# Patient Record
Sex: Male | Born: 1960 | Race: White | Hispanic: No | Marital: Married | Smoking: Never smoker
Health system: Southern US, Community
[De-identification: ages and names within clinical notes are randomized; demographics above are authoritative.]

---

## 2007-03-06 ENCOUNTER — Ambulatory Visit: Payer: Self-pay | Admitting: Emergency Medicine

## 2008-05-29 ENCOUNTER — Ambulatory Visit: Payer: Self-pay | Admitting: Family Medicine

## 2009-07-18 DIAGNOSIS — D239 Other benign neoplasm of skin, unspecified: Secondary | ICD-10-CM

## 2009-07-18 HISTORY — DX: Other benign neoplasm of skin, unspecified: D23.9

## 2010-07-16 ENCOUNTER — Ambulatory Visit: Payer: Self-pay | Admitting: Urology

## 2010-10-13 ENCOUNTER — Ambulatory Visit: Payer: Self-pay | Admitting: General Practice

## 2010-10-27 ENCOUNTER — Inpatient Hospital Stay: Payer: Self-pay | Admitting: General Practice

## 2011-05-20 ENCOUNTER — Ambulatory Visit: Payer: Self-pay | Admitting: Family Medicine

## 2011-12-30 IMAGING — US US RENAL KIDNEY
1 series · 17 of 25 positions shown · non-contrast
Comparison: none

REASON FOR EXAM: renal colic  mild chronic renal insufficiency hx of
nephrolithaisis
COMMENTS:

PROCEDURE:     [facility]  - July 16, 2010  [DATE]
RESULT:     The right kidney measures 12.2 cm and the left is 12 cm. There
is a 2.4 cm, simple cyst in the left kidney. Bilateral renal flow is
present. Urine is noted in the bladder.

[Series 1: us renal kidney · 17 of 27 slices shown]
[im 1/27]
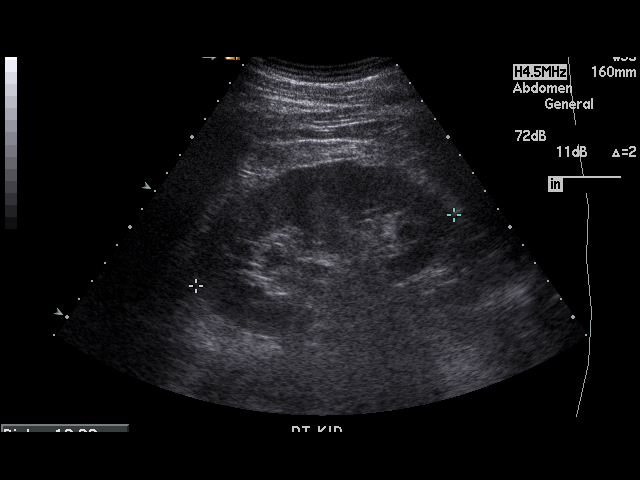
[im 3/27]
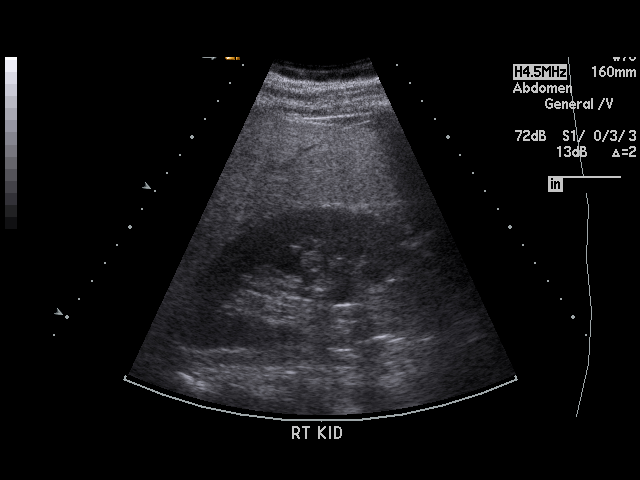
[im 4/27]
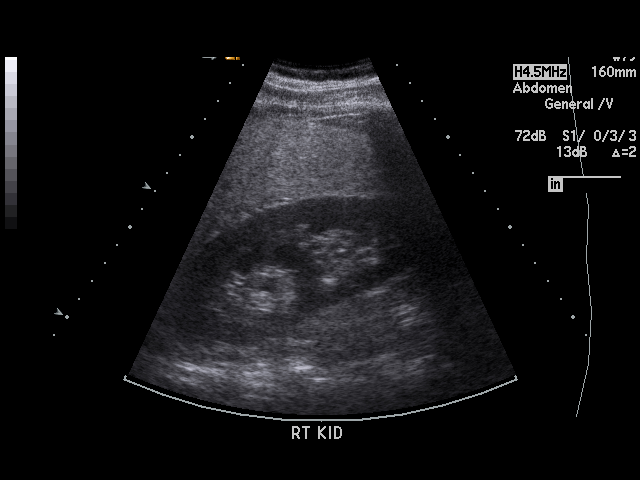
[im 6/27]
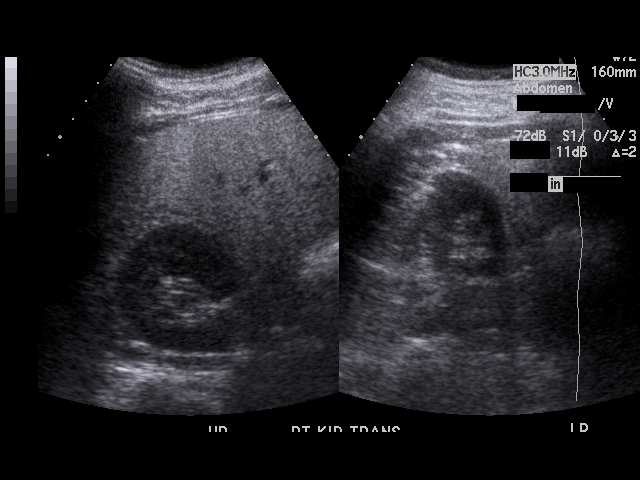
[im 7/27]
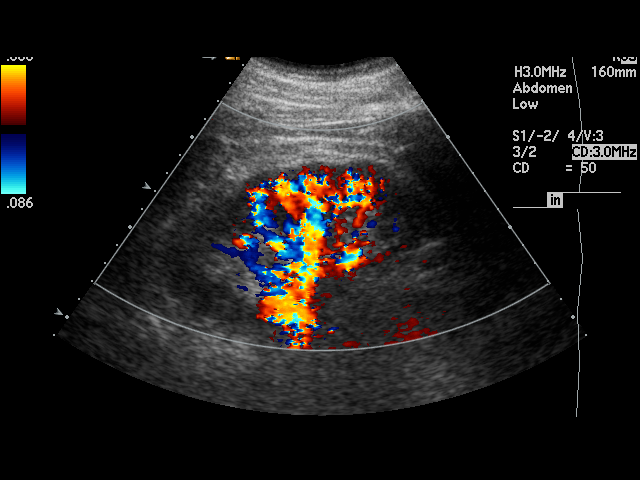
[im 9/27]
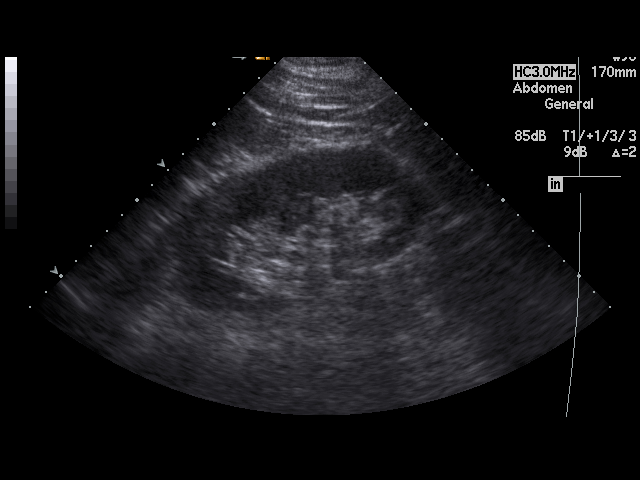
[im 10/27]
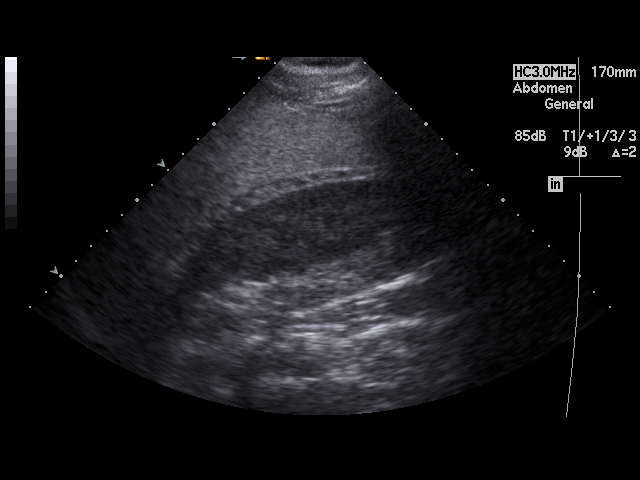
[im 12/27]
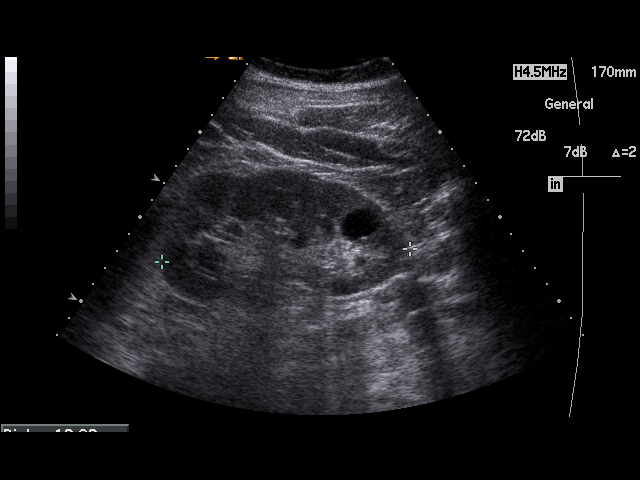
[im 14/27]
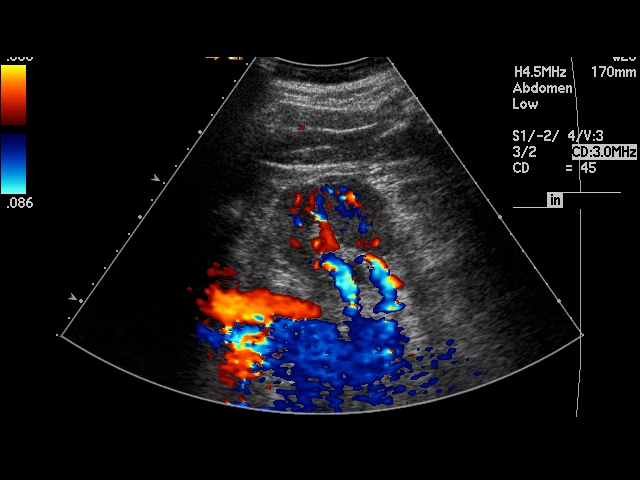
[im 15/27]
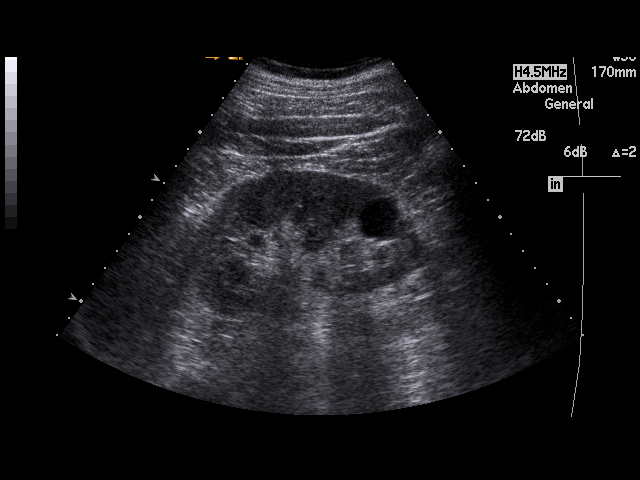
[im 17/27]
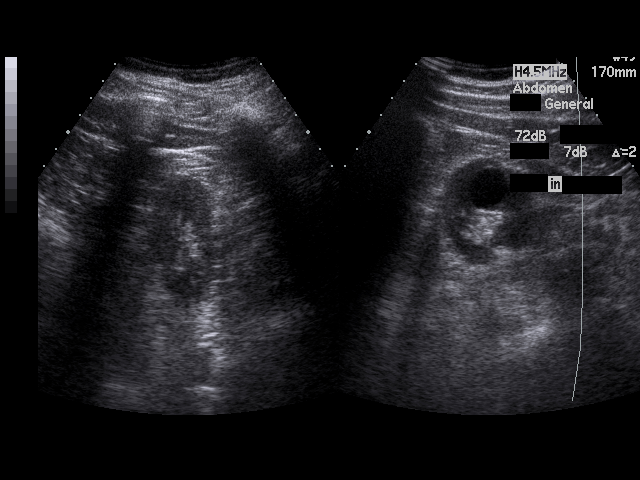
[im 18/27]
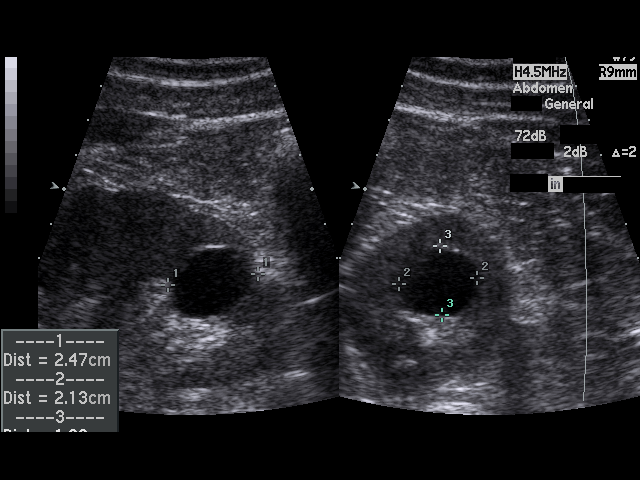
[im 20/27]
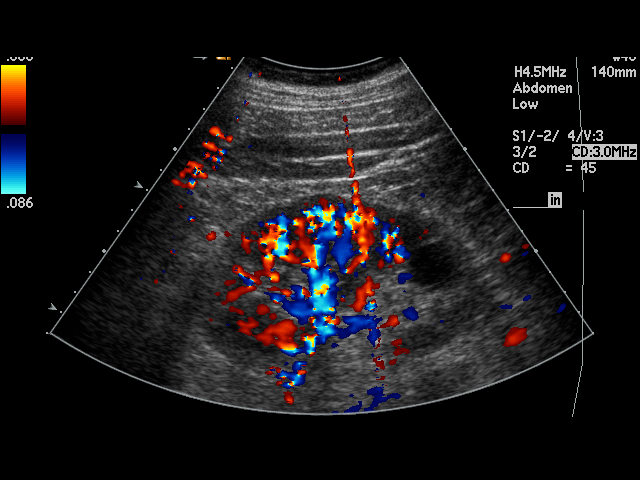
[im 21/27]
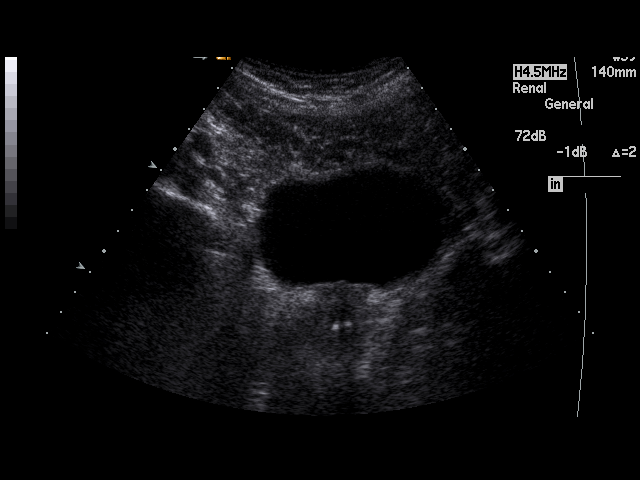
[im 23/27]
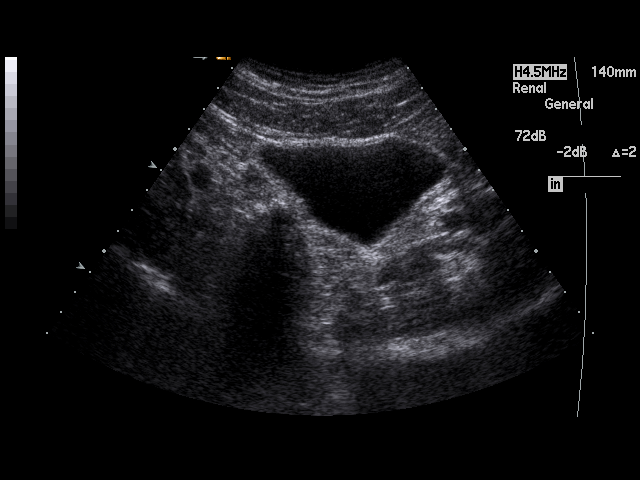
[im 24/27]
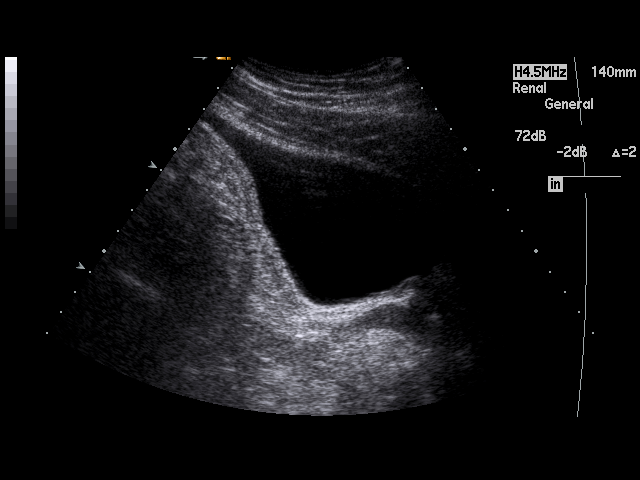
[im 27/27]
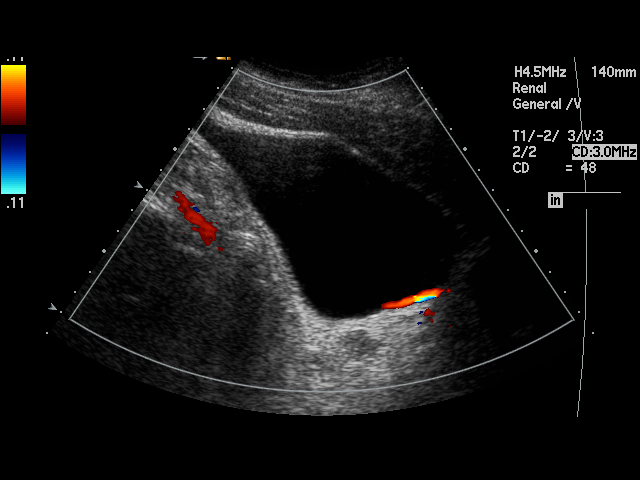

[17 of 25 positions shown; findings below may reference images not displayed]

IMPRESSION: Simple cyst of the left kidney. Otherwise, unremarkable
exam. Renal echotexture, size and shape is normal. No definite stones are
noted.

## 2018-02-04 ENCOUNTER — Telehealth: Payer: Self-pay | Admitting: Gastroenterology

## 2018-02-04 NOTE — Telephone Encounter (Signed)
Pathology from previous colonoscopy has been faxed.

## 2018-02-04 NOTE — Telephone Encounter (Signed)
[facility] left vm to [facility] Pathology report from referral from 2015 please fax to [phone]  Call back # [phone] ext [phone]

## 2019-02-10 ENCOUNTER — Other Ambulatory Visit: Payer: Self-pay | Admitting: Nephrology

## 2019-02-10 DIAGNOSIS — N183 Chronic kidney disease, stage 3 unspecified: Secondary | ICD-10-CM

## 2019-02-14 ENCOUNTER — Encounter (INDEPENDENT_AMBULATORY_CARE_PROVIDER_SITE_OTHER): Payer: Self-pay

## 2019-02-14 ENCOUNTER — Ambulatory Visit
Admission: RE | Admit: 2019-02-14 | Discharge: 2019-02-14 | Disposition: A | Discharge status: Home / Self Care | Payer: BLUE CROSS/BLUE SHIELD | Source: Ambulatory Visit | Attending: Nephrology | Admitting: Nephrology

## 2019-02-14 ENCOUNTER — Other Ambulatory Visit: Payer: Self-pay

## 2019-02-14 DIAGNOSIS — N183 Chronic kidney disease, stage 3 unspecified: Secondary | ICD-10-CM

## 2019-04-20 ENCOUNTER — Ambulatory Visit: Payer: Self-pay | Admitting: Urology

## 2020-12-04 ENCOUNTER — Encounter: Payer: Self-pay | Admitting: Dermatology

## 2020-12-04 ENCOUNTER — Telehealth: Payer: Self-pay

## 2020-12-04 ENCOUNTER — Other Ambulatory Visit: Payer: Self-pay

## 2020-12-04 ENCOUNTER — Ambulatory Visit (INDEPENDENT_AMBULATORY_CARE_PROVIDER_SITE_OTHER): Payer: BC Managed Care – PPO | Admitting: Dermatology

## 2020-12-04 DIAGNOSIS — Q825 Congenital non-neoplastic nevus: Secondary | ICD-10-CM

## 2020-12-04 DIAGNOSIS — D1800 Hemangioma unspecified site: Secondary | ICD-10-CM

## 2020-12-04 DIAGNOSIS — Z1283 Encounter for screening for malignant neoplasm of skin: Secondary | ICD-10-CM

## 2020-12-04 DIAGNOSIS — B351 Tinea unguium: Secondary | ICD-10-CM | POA: Diagnosis not present

## 2020-12-04 DIAGNOSIS — L918 Other hypertrophic disorders of the skin: Secondary | ICD-10-CM

## 2020-12-04 DIAGNOSIS — B353 Tinea pedis: Secondary | ICD-10-CM

## 2020-12-04 DIAGNOSIS — L814 Other melanin hyperpigmentation: Secondary | ICD-10-CM

## 2020-12-04 DIAGNOSIS — L719 Rosacea, unspecified: Secondary | ICD-10-CM

## 2020-12-04 DIAGNOSIS — D492 Neoplasm of unspecified behavior of bone, soft tissue, and skin: Secondary | ICD-10-CM

## 2020-12-04 DIAGNOSIS — D225 Melanocytic nevi of trunk: Secondary | ICD-10-CM

## 2020-12-04 DIAGNOSIS — L578 Other skin changes due to chronic exposure to nonionizing radiation: Secondary | ICD-10-CM

## 2020-12-04 DIAGNOSIS — D229 Melanocytic nevi, unspecified: Secondary | ICD-10-CM

## 2020-12-04 DIAGNOSIS — L821 Other seborrheic keratosis: Secondary | ICD-10-CM

## 2020-12-04 NOTE — Telephone Encounter (Signed)
Called pt discussed we need to know the name of his PCP he will be seeing this month to discuss his liver disease hx, pt do not have the name with him he will call me back with the doctors name

## 2020-12-04 NOTE — Progress Notes (Signed)
New Patient Visit  Subjective  Roy Austin is a 60 y.o. male who presents for the following: Annual Exam. Hx of Dysplastic nevus. He also has history of toenail dystrophy. It has been present since he was in the [employer]. The patient presents for Total-Body Skin Exam (TBSE) for skin cancer screening and mole check.  The following portions of the chart were reviewed this encounter and updated as appropriate:   Allergies  Meds  Problems  Med Hx  Surg Hx  Fam Hx     Review of Systems:  No other skin or systemic complaints except as noted in HPI or Assessment and Plan.  Objective  Well appearing patient in no apparent distress; mood and affect are within normal limits.  A full examination was performed including scalp, head, eyes, ears, nose, lips, neck, chest, axillae, abdomen, back, buttocks, bilateral upper extremities, bilateral lower extremities, hands, feet, fingers, toes, fingernails, and toenails. All findings within normal limits unless otherwise noted below.  Objective  Left lower quad abdomen: 0.6 cm irregular brown macule   Objective  toenails: Scaling and maceration web spaces and over distal and lateral soles.   Objective  Left post shoulder: Light brown flat macule    Assessment & Plan  Neoplasm of skin Left lower quad abdomen  Epidermal / dermal shaving  Lesion diameter (cm):  0.6 Informed consent: discussed and consent obtained   Timeout: patient name, date of birth, surgical site, and procedure verified   Procedure prep:  Patient was prepped and draped in usual sterile fashion Prep type:  Isopropyl alcohol Anesthesia: the lesion was anesthetized in a standard fashion   Anesthetic:  1% lidocaine w/ epinephrine 1-100,000 buffered w/ 8.4% NaHCO3 Hemostasis achieved with: pressure, aluminum chloride and electrodesiccation   Outcome: patient tolerated procedure well   Post-procedure details: sterile dressing applied and wound care instructions  given   Dressing type: bandage and petrolatum    Specimen 1 - Surgical pathology Differential Diagnosis: R/O Dysplastic nevus vs other   Check Margins: No 0.6 cm irregular brown macule  Rosacea Head - Anterior (Face) Discussed BBL laser and topical treatment. Patient declines at this time. Rosacea Erythematotelangiectatic  Rosacea is a chronic progressive skin condition usually affecting the face of adults, causing redness and/or acne bumps. It is treatable but not curable. It sometimes affects the eyes (ocular rosacea) as well. It may respond to topical and/or systemic medication and can flare with stress, sun exposure, alcohol, exercise and some foods.  Daily application of broad spectrum spf 30+ sunscreen to face is recommended to reduce flares.   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  Severe toenail dystrophy   Discussed topical versus oral treatment. Will start Lamisil 250 mg 1 a day pending okay by his primary care physician. He has history of "liver problems" (hereditary cirrhosis?).  If his PCP gives an okay for oral Lamisil, will follow-up in about 5 to 6 weeks.  Congenital nevus Left post shoulder  Benign-appearing.  Observation.  Call clinic for new or changing lesions.  Recommend daily use of broad spectrum spf 30+ sunscreen to sun-exposed areas.   Lentigines - Scattered tan macules - Due to sun exposure - Benign-appering, observe - Recommend daily broad spectrum sunscreen SPF 30+ to sun-exposed areas, reapply every 2 hours as needed. - Call for any changes  Seborrheic Keratoses - Stuck-on, waxy, tan-brown papules and plaques  - Discussed benign etiology and prognosis. - Observe - Call for any changes  Melanocytic Nevi - Tan-brown and/or pink-flesh-colored symmetric macules and papules - Benign appearing on exam today - Observation - Call clinic for new or changing moles - Recommend daily use of broad spectrum spf 30+ sunscreen to  sun-exposed areas.   Hemangiomas - Red papules - Discussed benign nature - Observe - Call for any changes  Actinic Damage - Chronic, secondary to cumulative UV/sun exposure - diffuse scaly erythematous macules with underlying dyspigmentation - Recommend daily broad spectrum sunscreen SPF 30+ to sun-exposed areas, reapply every 2 hours as needed.  - Call for new or changing lesions.  Acrochordons (Skin Tags) -discussed removal may consider on follow-up. - Fleshy, skin-colored pedunculated papules - Benign appearing.  - Observe. - If desired, they can be removed with an in office procedure that is not covered by insurance. - Please call the clinic if you notice any new or changing lesions.  Skin cancer screening performed today.  Follow-up in 5 to 6 weeks if we start oral Lamisil for his tinea pedis ending approval by his PCP. If not he may return for removal of skin tags whenever he is ready. Otherwise follow-up no later than 1 year.  IMarye Round, CMA, am acting as scribe for Sarina Ser, MD .  Documentation: I have reviewed the above documentation for accuracy and completeness, and I agree with the above.  Sarina Ser, MD

## 2020-12-04 NOTE — Patient Instructions (Signed)

## 2020-12-06 ENCOUNTER — Telehealth: Payer: Self-pay

## 2020-12-06 NOTE — Telephone Encounter (Signed)
-----   Message from Ralene Bathe, MD sent at 12/05/2020  7:12 PM EST ----- Diagnosis Skin , left lower quad abdomen DYSPLASTIC COMPOUND NEVUS WITH MODERATE ATYPIA, CLOSE TO MARGIN  Dysplastic Moderate Recheck next visit If no appt, make an appt for 6 months

## 2020-12-06 NOTE — Telephone Encounter (Signed)
Advised patient of results and scheduled 6 month follow up/hd 

## 2020-12-11 ENCOUNTER — Telehealth: Payer: Self-pay

## 2020-12-11 NOTE — Telephone Encounter (Signed)
-----   Message from Ralene Bathe, MD sent at 12/10/2020  4:04 PM EST ----- Did you communicate with this pt's PCP?  Need to know whether we can prescribe Lamisil. ----- Message ----- From: Arvin Collard, CMA Sent: 12/06/2020  10:43 AM EST To: Ralene Bathe, MD  I spoke with him today regarding his pathology and he said you wanted his PCP info. Dr Tonita Cong in [place] [phone]

## 2020-12-11 NOTE — Telephone Encounter (Signed)
Called  patients PCP office in [facility] Dr Karmen Stabs spoke with with Danielle Rankin, discussed with her we need to know whether we can prescribe Lamisil for this pt, she will relay this message to nurse and she will call me back

## 2020-12-12 NOTE — Telephone Encounter (Signed)
The MD within patient's PCP called back and left message on our nurse line. Physician was talking so fast I barley understood his name or call back number.

## 2020-12-16 ENCOUNTER — Telehealth: Payer: Self-pay

## 2020-12-16 NOTE — Telephone Encounter (Signed)
Called pt discussed we will need to order labs hepatic panel to recheck LFTs , he can come by the office to pick up this lab order, pt report he will be going to see a PCP in the next few weeks and he will have all his labs drawn at that time

## 2020-12-27 IMAGING — US US RENAL
1 series · 14 of 25 positions shown · non-contrast
Comparison: None available.

CLINICAL DATA: Initial evaluation for chronic kidney disease, stage
III.

EXAM:
RENAL / URINARY TRACT ULTRASOUND COMPLETE

[Series 1: us renal · 0.28mm/px · 14 of 48 slices shown]
[im 1/48]
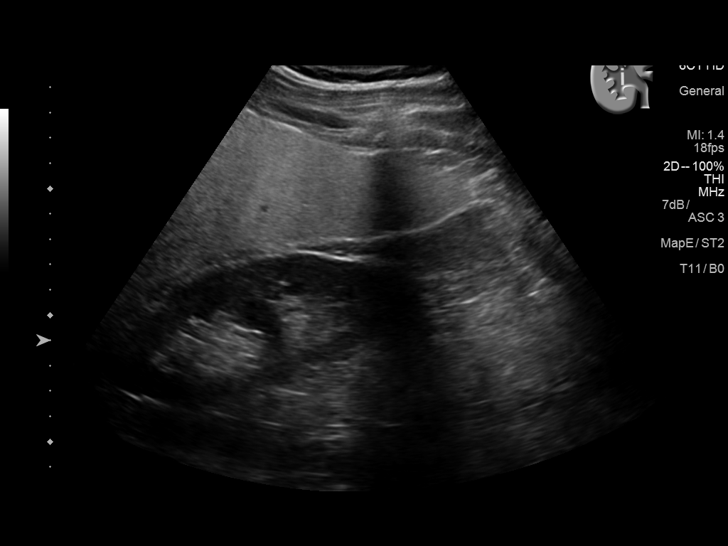
[im 4/48]
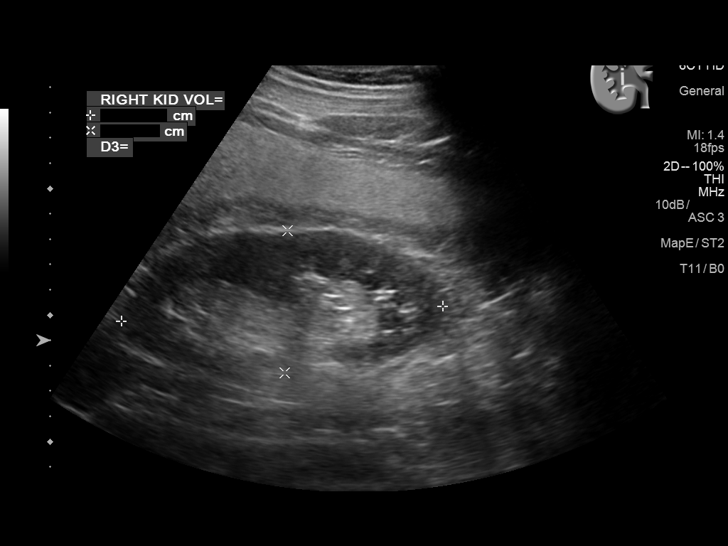
[im 8/48]
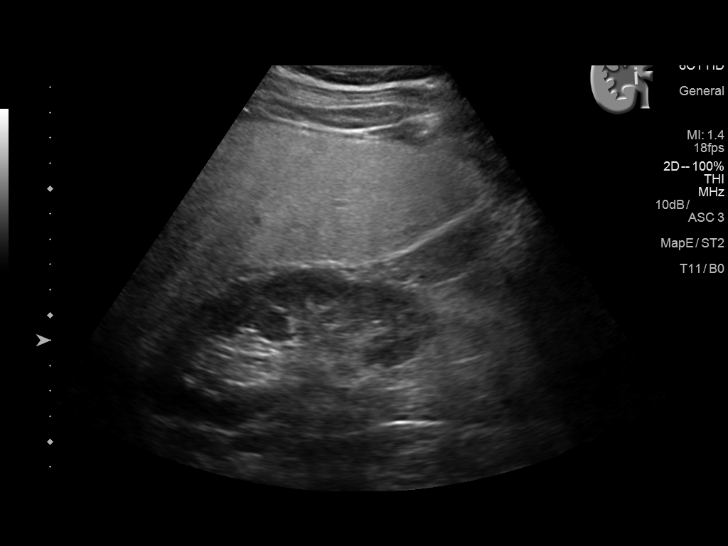
[im 12/48]
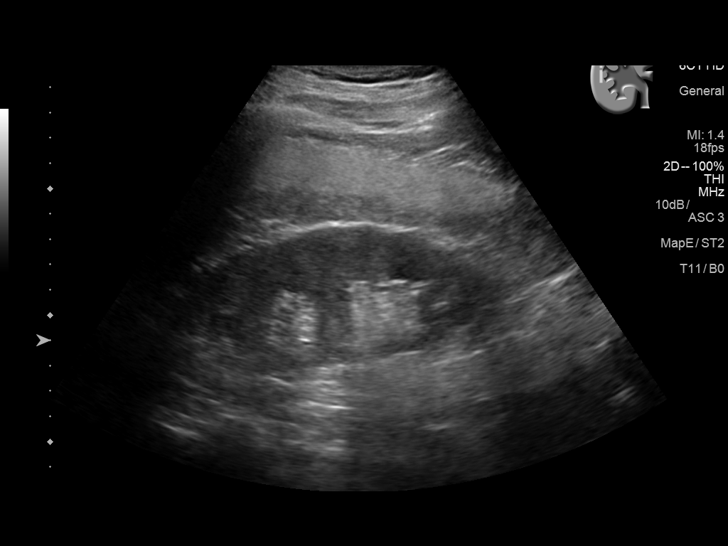
[im 16/48]
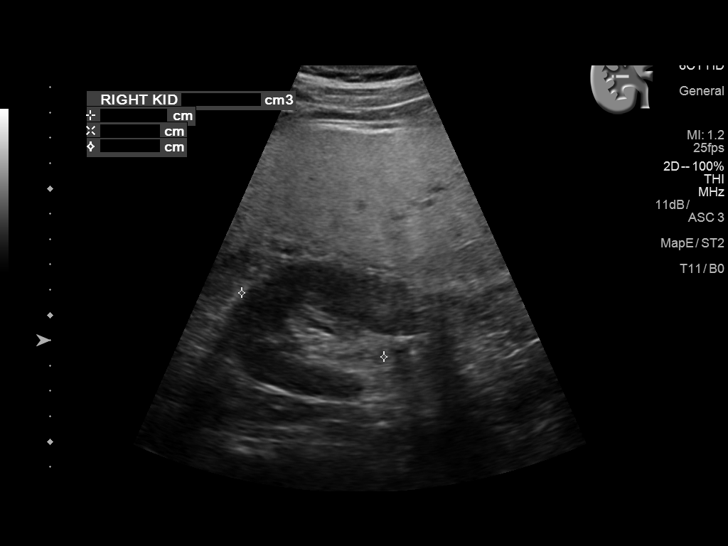
[im 18/48]
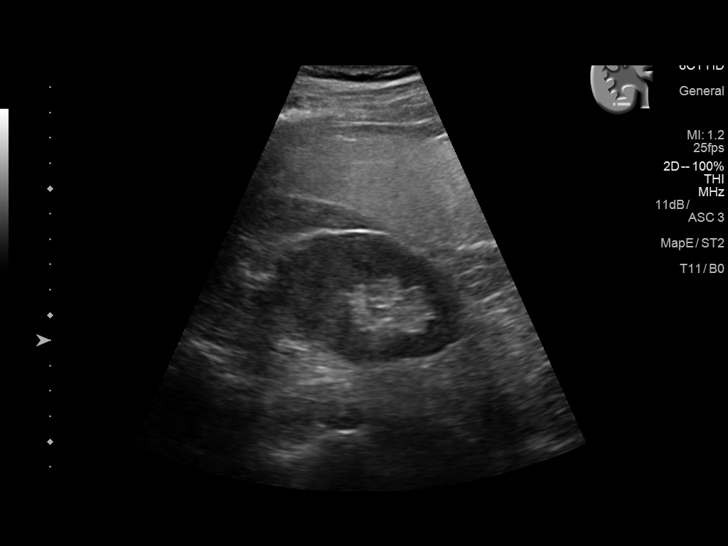
[im 22/48]
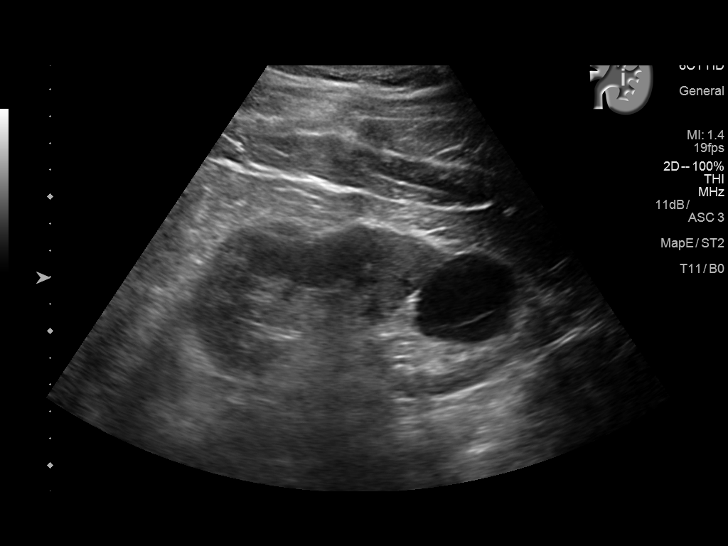
[im 26/48]
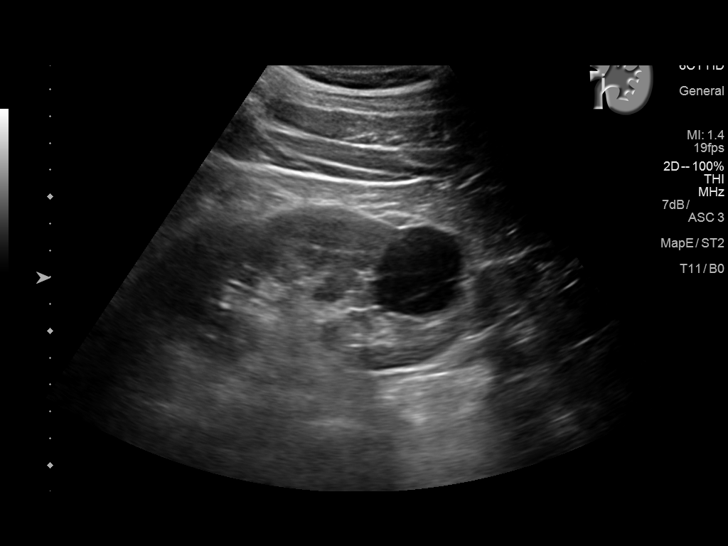
[im 30/48]
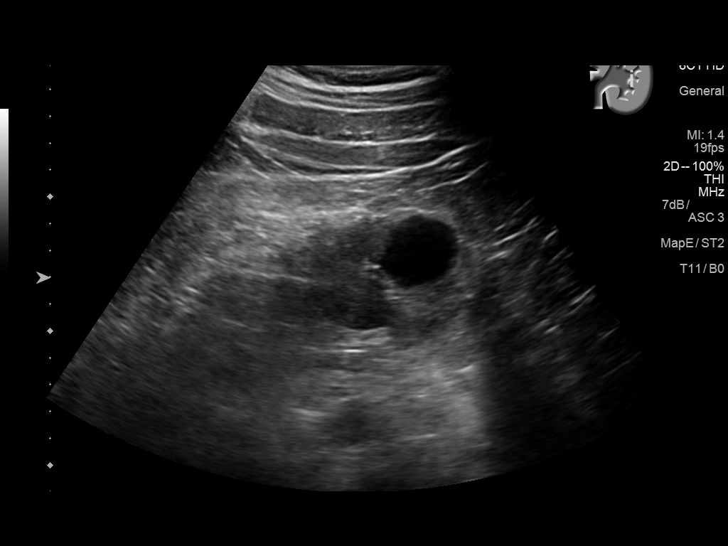
[im 32/48]
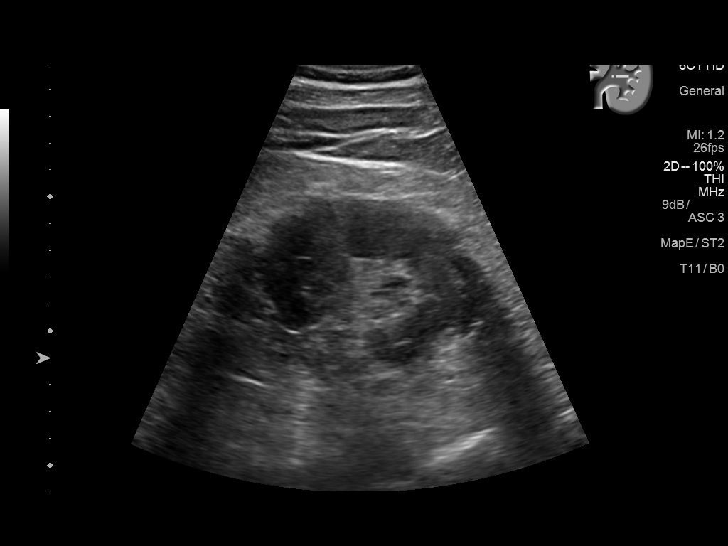
[im 36/48]
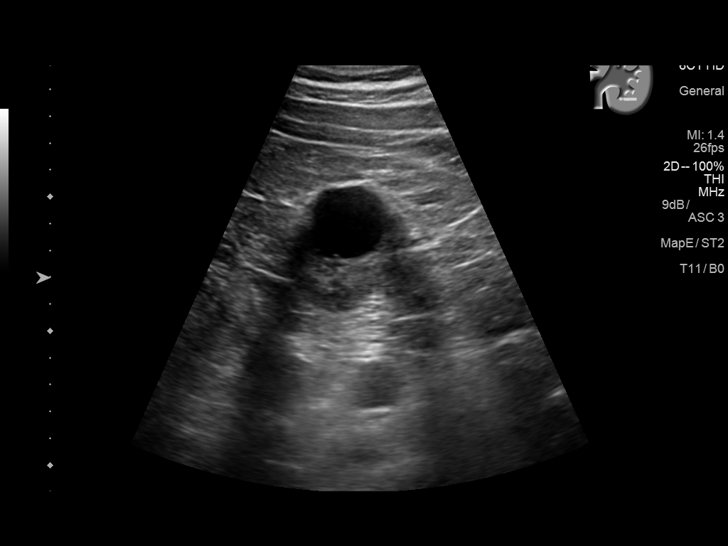
[im 40/48]
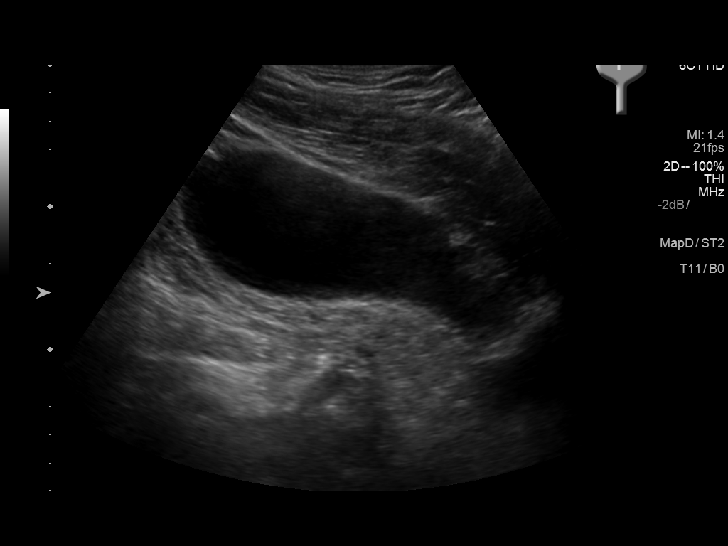
[im 44/48]
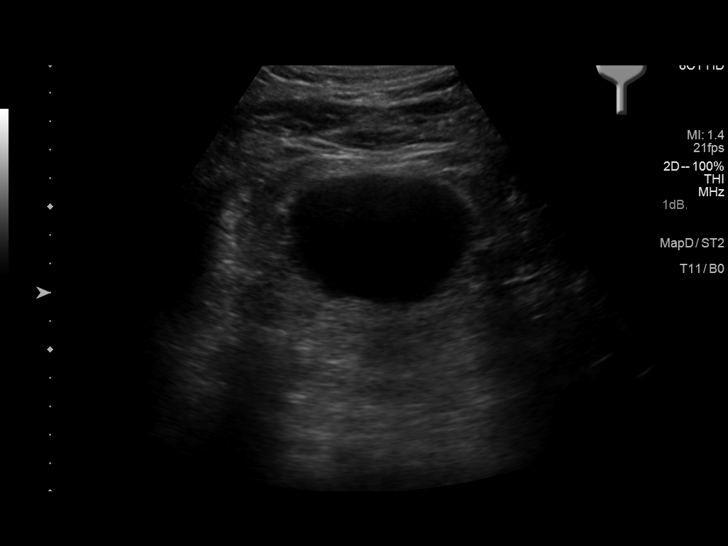
[im 48/48]
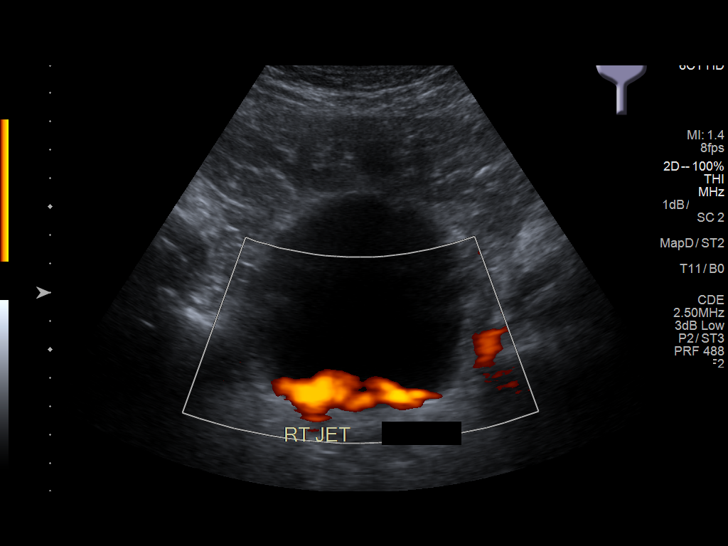

[14 of 25 positions shown; findings below may reference images not displayed]

FINDINGS: Right Kidney:

Renal measurements: 12.7 x 5.6 x 6.2 cm = volume: 230.4 mL .
Echogenicity within normal limits. No mass or hydronephrosis
visualized.

Left Kidney:

Renal measurements: 12.4 x 6.0 x 5.7 cm = volume: 221.5 mL.
Echogenicity within normal limits. No hydronephrosis. 3.4 x 3.3 x
3.6 cm cyst present at the lower pole the left kidney. A single thin
internal septation. No associated vascularity.

Bladder:

Appears normal for degree of bladder distention.
IMPRESSION: 1. 3.6 cm septated cyst at the lower pole of the left kidney.
2. Otherwise unremarkable renal ultrasound.  No hydronephrosis.

## 2021-01-08 NOTE — Telephone Encounter (Signed)
I spoke to this patient on March 14, per his PCP in [place] he do not recommend starting Lamisil without a repeat of LFT's due to abnormal labs drawn in 2021, discussed with pt he could come by to get a lab script for LFT;s per Dr Nicole Kindred, pt declined, pt reported he will wait until he get his new PCP established here in [place] then he will get a repeat lab order

## 2021-01-08 NOTE — Telephone Encounter (Signed)
OK, we will wait until he gets his new PCP, then if the PCP does not order LFTs, we will do it when he is ready or at his next visit.

## 2021-01-08 NOTE — Telephone Encounter (Signed)
Please just try to contact the doctor again asking original question(s) -- see chart note.

## 2021-06-18 ENCOUNTER — Ambulatory Visit: Payer: BC Managed Care – PPO | Admitting: Dermatology

## 2022-05-14 ENCOUNTER — Encounter: Payer: BC Managed Care – PPO | Admitting: Dermatology

## 2022-05-25 ENCOUNTER — Encounter: Payer: BC Managed Care – PPO | Admitting: Dermatology

## 2022-06-04 ENCOUNTER — Ambulatory Visit: Payer: BC Managed Care – PPO | Admitting: Dermatology

## 2022-06-04 DIAGNOSIS — L578 Other skin changes due to chronic exposure to nonionizing radiation: Secondary | ICD-10-CM

## 2022-06-04 DIAGNOSIS — L918 Other hypertrophic disorders of the skin: Secondary | ICD-10-CM

## 2022-06-04 DIAGNOSIS — Z1283 Encounter for screening for malignant neoplasm of skin: Secondary | ICD-10-CM

## 2022-06-04 DIAGNOSIS — D229 Melanocytic nevi, unspecified: Secondary | ICD-10-CM

## 2022-06-04 DIAGNOSIS — L814 Other melanin hyperpigmentation: Secondary | ICD-10-CM

## 2022-06-04 DIAGNOSIS — B351 Tinea unguium: Secondary | ICD-10-CM | POA: Diagnosis not present

## 2022-06-04 DIAGNOSIS — D1800 Hemangioma unspecified site: Secondary | ICD-10-CM

## 2022-06-04 DIAGNOSIS — Z86018 Personal history of other benign neoplasm: Secondary | ICD-10-CM | POA: Diagnosis not present

## 2022-06-04 DIAGNOSIS — L821 Other seborrheic keratosis: Secondary | ICD-10-CM

## 2022-06-04 DIAGNOSIS — B353 Tinea pedis: Secondary | ICD-10-CM | POA: Diagnosis not present

## 2022-06-04 MED ORDER — JUBLIA 10 % EX SOLN: 1.0000 "application " | Freq: Every day | CUTANEOUS | 11 refills | Status: DC

## 2022-06-04 MED ORDER — KETOCONAZOLE 2 % EX CREA: 1.0000 | TOPICAL_CREAM | Freq: Every day | CUTANEOUS | 11 refills | Status: DC

## 2022-06-04 NOTE — Progress Notes (Signed)
   Follow-Up Visit   Subjective  Roy Austin is a 61 y.o. male who presents for the following: Annual Exam (History of dysplastic nevi - The patient presents for Total-Body Skin Exam (TBSE) for skin cancer screening and mole check.  The patient has spots, moles and lesions to be evaluated, some may be new or changing and the patient has concerns that these could be cancer./).  The following portions of the chart were reviewed this encounter and updated as appropriate:   Allergies  Meds  Problems  Med Hx  Surg Hx  Fam Hx     Review of Systems:  No other skin or systemic complaints except as noted in HPI or Assessment and Plan.  Objective  Well appearing patient in no apparent distress; mood and affect are within normal limits.  A full examination was performed including scalp, head, eyes, ears, nose, lips, neck, chest, axillae, abdomen, back, buttocks, bilateral upper extremities, bilateral lower extremities, hands, feet, fingers, toes, fingernails, and toenails. All findings within normal limits unless otherwise noted below.  Left LQA Well healed biopsy site   Assessment & Plan   Acrochordons (Skin Tags) - Fleshy, skin-colored pedunculated papules - Benign appearing.  - Observe. - If desired, they can be removed with an in office procedure that is not covered by insurance. - Please call the clinic if you notice any new or changing lesions.  Lentigines - Scattered tan macules - Due to sun exposure - Benign-appearing, observe - Recommend daily broad spectrum sunscreen SPF 30+ to sun-exposed areas, reapply every 2 hours as needed. - Call for any changes  Seborrheic Keratoses - Stuck-on, waxy, tan-brown papules and/or plaques  - Benign-appearing - Discussed benign etiology and prognosis. - Observe - Call for any changes  Melanocytic Nevi - Tan-brown and/or pink-flesh-colored symmetric macules and papules - Benign appearing on exam today - Observation - Call  clinic for new or changing moles - Recommend daily use of broad spectrum spf 30+ sunscreen to sun-exposed areas.   Hemangiomas - Red papules - Discussed benign nature - Observe - Call for any changes  Actinic Damage - Chronic condition, secondary to cumulative UV/sun exposure - diffuse scaly erythematous macules with underlying dyspigmentation - Recommend daily broad spectrum sunscreen SPF 30+ to sun-exposed areas, reapply every 2 hours as needed.  - Staying in the shade or wearing long sleeves, sun glasses (UVA+UVB protection) and wide brim hats (4-inch brim around the entire circumference of the hat) are also recommended for sun protection.  - Call for new or changing lesions.  Skin cancer screening performed today.  History of dysplastic nevus Left LQA Clear. Observe for recurrence. Call clinic for new or changing lesions.  Recommend regular skin exams, daily broad-spectrum spf 30+ sunscreen use, and photoprotection.     Tinea pedis and Unguium of both feet Bilateral feet Chronic and persistent condition with duration or expected duration over one year. Condition is symptomatic / bothersome to patient. Not to goal. Start: Ketoconazole 2% cream qhs to feet,  Jublia solution qhs to nails  Efinaconazole (JUBLIA) 10 % SOLN - Bilateral feet Apply 1 application  topically at bedtime. ketoconazole (NIZORAL) 2 % cream - Bilateral feet Apply 1 Application topically at bedtime.  Return in about 1 year (around 06/05/2023) for TBSE.  I, Ashok Cordia, CMA, am acting as scribe for Sarina Ser, MD . Documentation: I have reviewed the above documentation for accuracy and completeness, and I agree with the above.  Sarina Ser, MD

## 2022-06-04 NOTE — Patient Instructions (Signed)
Due to recent changes in healthcare laws, you may see results of your pathology and/or laboratory studies on MyChart before the doctors have had a chance to review them. We understand that in some cases there may be results that are confusing or concerning to you. Please understand that not all results are received at the same time and often the doctors may need to interpret multiple results in order to provide you with the best plan of care or course of treatment. Therefore, we ask that you please give us 2 business days to thoroughly review all your results before contacting the office for clarification. Should we see a critical lab result, you will be contacted sooner.   If You Need Anything After Your Visit  If you have any questions or concerns for your doctor, please call our main line at [phone] and press option 4 to reach your doctor's medical assistant. If no one answers, please leave a voicemail as directed and we will return your call as soon as possible. Messages left after 4 pm will be answered the following business day.   You may also send us a message via MyChart. We typically respond to MyChart messages within 1-2 business days.  For prescription refills, please ask your pharmacy to contact our office. Our fax number is [phone].  If you have an urgent issue when the clinic is closed that cannot wait until the next business day, you can page your doctor at the number below.    Please note that while we do our best to be available for urgent issues outside of office hours, we are not available 24/7.   If you have an urgent issue and are unable to reach us, you may choose to seek medical care at your doctor's office, retail clinic, urgent care center, or emergency room.  If you have a medical emergency, please immediately call 911 or go to the emergency department.  Pager Numbers  - Dr. Kowalski: [phone]  - Dr. Moye: [phone]  - Dr. Stewart:  [phone]  In the event of inclement weather, please call our main line at [phone] for an update on the status of any delays or closures.  Dermatology Medication Tips: Please keep the boxes that topical medications come in in order to help keep track of the instructions about where and how to use these. Pharmacies typically print the medication instructions only on the boxes and not directly on the medication tubes.   If your medication is too expensive, please contact our office at [phone] option 4 or send us a message through MyChart.   We are unable to tell what your co-pay for medications will be in advance as this is different depending on your insurance coverage. However, we may be able to find a substitute medication at lower cost or fill out paperwork to get insurance to cover a needed medication.   If a prior authorization is required to get your medication covered by your insurance company, please allow us 1-2 business days to complete this process.  Drug prices often vary depending on where the prescription is filled and some pharmacies may offer cheaper prices.  The website www.goodrx.com contains coupons for medications through different pharmacies. The prices here do not account for what the cost may be with help from insurance (it may be cheaper with your insurance), but the website can give you the price if you did not use any insurance.  - You can print the associated coupon and take it with   your prescription to the pharmacy.  - You may also stop by our office during regular business hours and pick up a GoodRx coupon card.  - If you need your prescription sent electronically to a different pharmacy, notify our office through [facility] MyChart or by phone at [phone] option 4.     Si Usted Necesita Algo Despus de Su Visita  Tambin puede enviarnos un mensaje a travs de MyChart. Por lo general respondemos a los mensajes de MyChart en el transcurso de 1 a 2  das hbiles.  Para renovar recetas, por favor pida a su farmacia que se ponga en contacto con nuestra oficina. Nuestro nmero de fax es el [phone].  Si tiene un asunto urgente cuando la clnica est cerrada y que no puede esperar hasta el siguiente da hbil, puede llamar/localizar a su doctor(a) al nmero que aparece a continuacin.   Por favor, tenga en cuenta que aunque hacemos todo lo posible para estar disponibles para asuntos urgentes fuera del horario de oficina, no estamos disponibles las 24 horas del da, los 7 das de la semana.   Si tiene un problema urgente y no puede comunicarse con nosotros, puede optar por buscar atencin mdica  en el consultorio de su doctor(a), en una clnica privada, en un centro de atencin urgente o en una sala de emergencias.  Si tiene una emergencia mdica, por favor llame inmediatamente al 911 o vaya a la sala de emergencias.  Nmeros de bper  - Dr. Kowalski: [phone]  - Dra. Moye: [phone]  - Dra. Stewart: [phone]  En caso de inclemencias del tiempo, por favor llame a nuestra lnea principal al [phone] para una actualizacin sobre el estado de cualquier retraso o cierre.  Consejos para la medicacin en dermatologa: Por favor, guarde las cajas en las que vienen los medicamentos de uso tpico para ayudarle a seguir las instrucciones sobre dnde y cmo usarlos. Las farmacias generalmente imprimen las instrucciones del medicamento slo en las cajas y no directamente en los tubos del medicamento.   Si su medicamento es muy caro, por favor, pngase en contacto con nuestra oficina llamando al [phone] y presione la opcin 4 o envenos un mensaje a travs de MyChart.   No podemos decirle cul ser su copago por los medicamentos por adelantado ya que esto es diferente dependiendo de la cobertura de su seguro. Sin embargo, es posible que podamos encontrar un medicamento sustituto a menor costo o llenar un formulario para que el  seguro cubra el medicamento que se considera necesario.   Si se requiere una autorizacin previa para que su compaa de seguros cubra su medicamento, por favor permtanos de 1 a 2 das hbiles para completar este proceso.  Los precios de los medicamentos varan con frecuencia dependiendo del lugar de dnde se surte la receta y alguna farmacias pueden ofrecer precios ms baratos.  El sitio web www.goodrx.com tiene cupones para medicamentos de diferentes farmacias. Los precios aqu no tienen en cuenta lo que podra costar con la ayuda del seguro (puede ser ms barato con su seguro), pero el sitio web puede darle el precio si no utiliz ningn seguro.  - Puede imprimir el cupn correspondiente y llevarlo con su receta a la farmacia.  - Tambin puede pasar por nuestra oficina durante el horario de atencin regular y recoger una tarjeta de cupones de GoodRx.  - Si necesita que su receta se enve electrnicamente a una farmacia diferente, informe a nuestra oficina a travs de MyChart de [facility]   o por telfono llamando al [phone] y presione la opcin 4.  

## 2022-06-12 ENCOUNTER — Encounter: Payer: Self-pay | Admitting: Dermatology

## 2023-04-21 ENCOUNTER — Ambulatory Visit: Payer: BC Managed Care – PPO | Admitting: Urology

## 2023-04-21 VITALS — BP 150/82 | HR 89 | Ht 68.0 in | Wt 240.0 lb

## 2023-04-21 DIAGNOSIS — N489 Disorder of penis, unspecified: Secondary | ICD-10-CM

## 2023-04-21 DIAGNOSIS — Z125 Encounter for screening for malignant neoplasm of prostate: Secondary | ICD-10-CM | POA: Diagnosis not present

## 2023-04-21 NOTE — Progress Notes (Signed)
   I, Duke Salvia, acting as a Neurosurgeon for Roy Altes, MD., have documented all relevant documentation on the behalf of Roy Altes, MD, as directed by  Roy Altes, MD while in the presence of Roy Altes, MD.  04/21/2023 5:17 PM   Roy Austin Roy Austin 09/03/1961 [id number]  Referring provider: Berniece Pap, FNP [address] [place],  [place] [address]  Chief Complaint  Patient presents with   Other    HPI: Roy Austin is a 62 y.o. male referred for evaluation of penile lesion. He presents today with his wife.  Approximately 6 weeks ago during intercourse, he noted mild discomfort and subsequently noted a reddened area on the left corona radiata with a slight slit near the penile corona junction. This has persisted over the last 6 weeks and he has treated with coconut oild. It is slowly improving but still present.  He has an appointment in one week with Dr. Gwen Pounds at [facility].    PMH: Past Medical History:  Diagnosis Date   Dysplastic nevus 07/18/2009   mid back - moderate, excision 08/20/2009   Dysplastic nevus 12/04/2020   left LQA - moderate     Home Medications:  Allergies as of 04/21/2023   No Known Allergies      Medication List        Accurate as of April 21, 2023  5:17 PM. If you have any questions, ask your nurse or doctor.          Jublia 10 % Soln Generic drug: Efinaconazole Apply 1 application  topically at bedtime.   ketoconazole 2 % cream Commonly known as: NIZORAL Apply 1 Application topically at bedtime.         Physical Exam: BP (!) 150/82   Pulse 89   Ht 5\' 8"  (1.727 m)   Wt 240 lb (108.9 kg)   BMI 36.49 kg/m   Constitutional:  Alert and oriented, No acute distress. HEENT: [place] AT, moist mucus membranes.  Trachea midline, no masses. Cardiovascular: No clubbing, cyanosis, or edema. Respiratory: Normal respiratory effort, no increased work of breathing. GI: Abdomen is soft, nontender,  nondistended, no abdominal masses GU: Phallus is circumcised, there is ~5x10 mm ertythematous segment of the corona radiata just to the left of midline, slightly edematous, not friable or oozing, no vesicles. Prostate 40 g, soft, smooth without nodules. Psychiatric: Normal mood and affect.   Assessment & Plan:    1. Penile lesion Low suspicion of premalignant lesion. Keep dermatology appointment next week. Will most likely be treated with a topical steroid and if not resolved would recc biopsy, which could be done here or with dermatology.  2. Prostate cancer screening Benign DRE. He has elected to pursue prostate cancer screening  PSA drawn today. He desires to continue annual urology follow up for prostate cancer screening.  I have reviewed the above documentation for accuracy and completeness, and I agree with the above.   Roy Altes, MD  [facility] [address], [address] [place], [place] [address] [phone]

## 2023-04-22 ENCOUNTER — Encounter: Payer: Self-pay | Admitting: Urology

## 2023-04-22 LAB — PSA: Prostate Specific Ag, Serum: 1.2 ng/mL (ref 0.0–4.0)

## 2023-04-28 ENCOUNTER — Encounter: Payer: Self-pay | Admitting: Dermatology

## 2023-04-28 ENCOUNTER — Ambulatory Visit: Payer: BC Managed Care – PPO | Admitting: Dermatology

## 2023-04-28 VITALS — BP 176/97 | HR 87

## 2023-04-28 DIAGNOSIS — L711 Rhinophyma: Secondary | ICD-10-CM

## 2023-04-28 DIAGNOSIS — Z79899 Other long term (current) drug therapy: Secondary | ICD-10-CM

## 2023-04-28 DIAGNOSIS — R220 Localized swelling, mass and lump, head: Secondary | ICD-10-CM

## 2023-04-28 DIAGNOSIS — L578 Other skin changes due to chronic exposure to nonionizing radiation: Secondary | ICD-10-CM

## 2023-04-28 DIAGNOSIS — B379 Candidiasis, unspecified: Secondary | ICD-10-CM

## 2023-04-28 DIAGNOSIS — B3742 Candidal balanitis: Secondary | ICD-10-CM

## 2023-04-28 DIAGNOSIS — R609 Edema, unspecified: Secondary | ICD-10-CM

## 2023-04-28 DIAGNOSIS — W908XXA Exposure to other nonionizing radiation, initial encounter: Secondary | ICD-10-CM

## 2023-04-28 DIAGNOSIS — L719 Rosacea, unspecified: Secondary | ICD-10-CM

## 2023-04-28 DIAGNOSIS — N481 Balanitis: Secondary | ICD-10-CM

## 2023-04-28 DIAGNOSIS — Z7189 Other specified counseling: Secondary | ICD-10-CM

## 2023-04-28 MED ORDER — KETOCONAZOLE 2 % EX CREA: TOPICAL_CREAM | CUTANEOUS | 2 refills | Status: DC

## 2023-04-28 MED ORDER — MOMETASONE FUROATE 0.1 % EX CREA: 1.0000 | TOPICAL_CREAM | Freq: Every day | CUTANEOUS | 2 refills | Status: AC

## 2023-04-28 MED ORDER — FLUCONAZOLE 200 MG PO TABS
ORAL_TABLET | ORAL | 0 refills | Status: AC
Start: 2023-04-28 — End: ?

## 2023-04-28 NOTE — Patient Instructions (Addendum)
For irritation at groin  Start ketoconazole 2 % cream apply topically to affected area once or twice daily on Tuesday Thursday and Saturday  Start mometasone cream apply topically to affected area once or twice daily on Monday Wednesday and Friday until clear  Topical steroids (such as triamcinolone, fluocinolone, fluocinonide, mometasone, clobetasol, halobetasol, betamethasone, hydrocortisone) can cause thinning and lightening of the skin if they are used for too long in the same area. Your physician has selected the right strength medicine for your problem and area affected on the body. Please use your medication only as directed by your physician to prevent side effects.   Start fluconazole tab 200 mg - take one by mouth once weekly for 3 weeks    For face / nose   Will prescribe [facility] metronidazole/ivermectin/azelaic acid once to twice daily as needed to affected areas on the face. The patient was advised this is not covered by insurance since it is made by a compounding pharmacy. They will receive an email to check out and the medication will be mailed to their home.   Recommend OTC benzoyl peroxide cleanser, wash affected areas daily in shower, let sit several minutes prior to rinsing.  May bleach towels if not rinsed off completely.  Recommended brands include Panoxyl 4% Creamy Wash, CeraVe Acne Foaming Cream wash, or Cetaphil Gentle Clear Complexion-Clearing BPO Acne Cleanser.    Instructions for [facility] Medications  One or more of your medications was sent to the [facility] mail order compounding pharmacy. You will receive an email from them and can purchase the medicine through that link. It will then be mailed to your home at the address you confirmed. If for any reason you do not receive an email from them, please check your spam folder. If you still do not find the email, please let us know. [facility] phone number is [phone].    Rosacea  What  is rosacea? Rosacea (say: ro-zay-sha) is a common skin disease that usually begins as a trend of flushing or blushing easily.  As rosacea progresses, a persistent redness in the center of the face will develop and may gradually spread beyond the nose and cheeks to the forehead and chin.  In some cases, the ears, chest, and back could be affected.  Rosacea may appear as tiny blood vessels or small red bumps that occur in crops.  Frequently they can contain pus, and are called "pustules".  If the bumps do not contain pus, they are referred to as "papules".  Rarely, in prolonged, untreated cases of rosacea, the oil glands of the nose and cheeks may become permanently enlarged.  This is called rhinophyma, and is seen more frequently in men.  Signs and Risks In its beginning stages, rosacea tends to come and go, which makes it difficult to recognize.  It can start as intermittent flushing of the face.  Eventually, blood vessels may become permanently visible.  Pustules and papules can appear, but can be mistaken for adult acne.  People of all races, ages, genders and ethnic groups are at risk of developing rosacea.  However, it is more common in women (especially around menopause) and adults with fair skin between the ages of 21 and 51.  Treatment Dermatologists typically recommend a combination of treatments to effectively manage rosacea.  Treatment can improve symptoms and may stop the progression of the rosacea.  Treatment may involve both topical and oral medications.  The tetracycline antibiotics are often used for their anti-inflammatory  effect; however, because of the possibility of developing antibiotic resistance, they should not be used long term at full dose.  For dilated blood vessels the options include electrodessication (uses electric current through a small needle), laser treatment, and cosmetics to hide the redness.   With all forms of treatment, improvement is a slow process, and patients may  not see any results for the first 3-4 weeks.  It is very important to avoid the sun and other triggers.  Patients must wear sunscreen daily.  Skin Care Instructions: Cleanse the skin with a mild soap such as CeraVe cleanser, Cetaphil cleanser, or Dove soap once or twice daily as needed. Moisturize with Eucerin Redness Relief Daily Perfecting Lotion (has a subtle green tint), CeraVe Moisturizing Cream, or Oil of Olay Daily Moisturizer with sunscreen every morning and/or night as recommended. Makeup should be "non-comedogenic" (won't clog pores) and be labeled "for sensitive skin". Good choices for cosmetics are: Neutrogena, Almay, and Physician's Formula.  Any product with a green tint tends to offset a red complexion. If your eyes are dry and irritated, use artificial tears 2-3 times per day and cleanse the eyelids daily with baby shampoo.  Have your eyes examined at least every 2 years.  Be sure to tell your eye doctor that you have rosacea. Alcoholic beverages tend to cause flushing of the skin, and may make rosacea worse. Always wear sunscreen, protect your skin from extreme hot and cold temperatures, and avoid spicy foods, hot drinks, and mechanical irritation such as rubbing, scrubbing, or massaging the face.  Avoid harsh skin cleansers, cleansing masks, astringents, and exfoliation. If a particular product burns or makes your face feel tight, then it is likely to flare your rosacea. If you are having difficulty finding a sunscreen that you can tolerate, you may try switching to a chemical-free sunscreen.  These are ones whose active ingredient is zinc oxide or titanium dioxide only.  They should also be fragrance free, non-comedogenic, and labeled for sensitive skin. Rosacea triggers may vary from person to person.  There are a variety of foods that have been reported to trigger rosacea.  Some patients find that keeping a diary of what they were doing when they flared helps them avoid  triggers.       Due to recent changes in healthcare laws, you may see results of your pathology and/or laboratory studies on MyChart before the doctors have had a chance to review them. We understand that in some cases there may be results that are confusing or concerning to you. Please understand that not all results are received at the same time and often the doctors may need to interpret multiple results in order to provide you with the best plan of care or course of treatment. Therefore, we ask that you please give [place] 2 business days to thoroughly review all your results before contacting the office for clarification. Should we see a critical lab result, you will be contacted sooner.   If You Need Anything After Your Visit  If you have any questions or concerns for your doctor, please call our main line at [phone] and press option 4 to reach your doctor's medical assistant. If no one answers, please leave a voicemail as directed and we will return your call as soon as possible. Messages left after 4 pm will be answered the following business day.   You may also send [place] a message via MyChart. We typically respond to MyChart messages within 1-2 business days.  For prescription refills, please ask your pharmacy to contact our office. Our fax number is [phone].  If you have an urgent issue when the clinic is closed that cannot wait until the next business day, you can page your doctor at the number below.    Please note that while we do our best to be available for urgent issues outside of office hours, we are not available 24/7.   If you have an urgent issue and are unable to reach [place], you may choose to seek medical care at your doctor's office, retail clinic, urgent care center, or emergency room.  If you have a medical emergency, please immediately call 911 or go to the emergency department.  Pager Numbers  - Dr. Gwen Pounds: [phone]  - Dr. Neale Burly: [phone]  - Dr.  Roseanne Reno: [phone]  In the event of inclement weather, please call our main line at [phone] for an update on the status of any delays or closures.  Dermatology Medication Tips: Please keep the boxes that topical medications come in in order to help keep track of the instructions about where and how to use these. Pharmacies typically print the medication instructions only on the boxes and not directly on the medication tubes.   If your medication is too expensive, please contact our office at [phone] option 4 or send [place] a message through MyChart.   We are unable to tell what your co-pay for medications will be in advance as this is different depending on your insurance coverage. However, we may be able to find a substitute medication at lower cost or fill out paperwork to get insurance to cover a needed medication.   If a prior authorization is required to get your medication covered by your insurance company, please allow [place] 1-2 business days to complete this process.  Drug prices often vary depending on where the prescription is filled and some pharmacies may offer cheaper prices.  The website www.goodrx.com contains coupons for medications through different pharmacies. The prices here do not account for what the cost may be with help from insurance (it may be cheaper with your insurance), but the website can give you the price if you did not use any insurance.  - You can print the associated coupon and take it with your prescription to the pharmacy.  - You may also stop by our office during regular business hours and pick up a GoodRx coupon card.  - If you need your prescription sent electronically to a different pharmacy, notify our office through [facility] or by phone at [phone] option 4.     Si Usted Necesita Algo Despus de Su Visita  Tambin puede enviarnos un mensaje a travs de Clinical cytogeneticist. Por lo general respondemos a los mensajes de MyChart en el transcurso  de 1 a 2 das hbiles.  Para renovar recetas, por favor pida a su farmacia que se ponga en contacto con nuestra oficina. Annie Sable de fax es [place] [phone].  Si tiene un asunto urgente cuando la clnica est cerrada y que no puede esperar hasta el siguiente da hbil, puede llamar/localizar a su doctor(a) al nmero que aparece a continuacin.   Por favor, tenga en cuenta que aunque hacemos todo lo posible para estar disponibles para asuntos urgentes fuera del horario de [place], no estamos disponibles las 24 horas del da, los 7 [address]  [address] de la [place].   Si tiene un problema urgente y no puede comunicarse con nosotros, puede optar por buscar atencin mdica  en  el consultorio de su doctor(a), en una clnica privada, en un centro de atencin urgente o en una sala de emergencias.  Si tiene Engineer, drilling, por favor llame inmediatamente al 911 o vaya a la sala de emergencias.  Nmeros de bper  - Dr. Gwen Pounds: [phone]  - Dra. Moye: [phone]  - Dra. Roseanne Reno: [phone]  En caso de inclemencias del [place], por favor llame a Lacy Duverney principal al [phone] para una actualizacin sobre el [place] de cualquier retraso o cierre.  Consejos para la medicacin en dermatologa: Por favor, guarde las cajas en las que vienen los medicamentos de uso tpico para ayudarle a seguir las instrucciones sobre dnde y cmo usarlos. Las farmacias generalmente imprimen las instrucciones del medicamento slo en las cajas y no directamente en los tubos del [place].   Si su medicamento es muy caro, por favor, pngase en contacto con Rolm Gala llamando al [phone] y presione la opcin 4 o envenos un mensaje a travs de Clinical cytogeneticist.   No podemos decirle cul ser su copago por los medicamentos por adelantado ya que esto es diferente dependiendo de la cobertura de su seguro. Sin embargo, es posible que podamos encontrar un medicamento sustituto a Audiological scientist un formulario  para que el seguro cubra el medicamento que se considera necesario.   Si se requiere una autorizacin previa para que su compaa de seguros [place] su medicamento, por favor permtanos de 1 a 2 das hbiles para completar [address]  Los precios de los medicamentos varan con frecuencia dependiendo del Environmental consultant de dnde se surte la receta y alguna farmacias pueden ofrecer precios ms baratos.  El sitio web www.goodrx.com tiene cupones para medicamentos de Health and safety inspector. Los precios aqu no tienen en cuenta lo que podra costar con la ayuda del seguro (puede ser ms barato con su seguro), pero el sitio web puede darle el precio si no utiliz Tourist information centre manager.  - Puede imprimir el cupn correspondiente y llevarlo con su receta a la farmacia.  - Tambin puede pasar por nuestra oficina durante el horario de atencin regular y Education officer, museum una tarjeta de cupones de GoodRx.  - Si necesita que su receta se enve electrnicamente a una farmacia diferente, informe a nuestra oficina a travs de MyChart de [place] o por telfono llamando al [phone] y presione la opcin 4.

## 2023-04-28 NOTE — Progress Notes (Signed)
Follow-Up Visit   Subjective  Roy Austin is a 62 y.o. male who presents for the following: spot at penis that has been there for a month redness and inflamed , spot at left side of nose, spot at right side of scalp The patient has spots, moles and lesions to be evaluated, some may be new or changing and the patient may have concern these could be cancer.  The following portions of the chart were reviewed this encounter and updated as appropriate: medications, allergies, medical history  Review of Systems:  No other skin or systemic complaints except as noted in HPI or Assessment and Plan.  Objective  Well appearing patient in no apparent distress; mood and affect are within normal limits. A focused examination was performed of the following areas: Penis, left side of nose, right scalp  Relevant exam findings are noted in the Assessment and Plan.   Assessment & Plan   Lipoma vs other Exam:  3 x 1.5 cm linear rubbery nodule at right posterior lateral scalp  Treatment Plan: Skin surface looks normal  Benign. Observe.  Do not recommend any treatment at this time.   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 Condition is bothersome/symptomatic for patient. Currently flared. Rosacea is a chronic progressive skin condition usually affecting the face of adults, causing redness and/or acne bumps. It is treatable but not curable. It sometimes affects the eyes (ocular rosacea) as well. It may respond to topical and/or systemic medication and can flare with stress, sun exposure, alcohol, exercise, topical steroids (including hydrocortisone/cortisone 10) and some foods.  Daily application of broad spectrum spf 30+ sunscreen to face is recommended to reduce flares.  Treatment Plan Will prescribe [facility] metronidazole/ivermectin/azelaic acid twice daily as needed to  affected areas on the face. The patient was advised this is not covered by insurance since it is made by a compounding pharmacy. They will receive an email to check out and the medication will be mailed to their home.   Instructions for [facility] Medications One or more of your medications was sent to the [facility] mail order compounding pharmacy. You will receive an email from them and can purchase the medicine through that link. It will then be mailed to your home at the address you confirmed. If for any reason you do not receive an email from them, please check your spam folder. If you still do not find the email, please let us know. [facility] phone number is [phone].  Recommend OTC benzoyl peroxide cleanser, wash affected areas daily in shower, let sit several minutes prior to rinsing.  May bleach towels if not rinsed off completely.  Recommended brands include Panoxyl 4% Creamy Wash, CeraVe Acne Foaming Cream wash, or Cetaphil Gentle Clear Complexion-Clearing BPO Acne Cleanser.  Balanitis with Candida  Exam: 1.1 x 0.6 cm pink peeling discoloration Right corona of glans penis    Treatment Plan: Benign. Observe Associated with yeast Pt with History of recurrent thrush  Reports history of elevated LFTs  Patient will send mychart with most recent labs.  If Liver tests are OK will: Start Fluconazole 200 mg tab - take 1 tab by mouth once weekly for 3 weeks. Disp #3 0rf  May start now: Start Ketoconazole 2 % cream apply topically to aa qd/bid 3 times weekly on T-Thr-Sat Start mometasone cream - apply topically to aa qd/bid  3 times weekly on M-W-F until rash is clear.   Topical steroids (such as triamcinolone, fluocinolone, fluocinonide, mometasone, clobetasol, halobetasol, betamethasone, hydrocortisone) can cause thinning and lightening of the skin if they are used for too long in the same area. Your physician has selected the right strength medicine for your problem and  area affected on the body. Please use your medication only as directed by your physician to prevent side effects.   ACTINIC DAMAGE - chronic, secondary to cumulative UV radiation exposure/sun exposure over time - diffuse scaly erythematous macules with underlying dyspigmentation - Recommend daily broad spectrum sunscreen SPF 30+ to sun-exposed areas, reapply every 2 hours as needed.  - Recommend staying in the shade or wearing long sleeves, sun glasses (UVA+UVB protection) and wide brim hats (4-inch brim around the entire circumference of the hat). - Call for new or changing lesions.  Return for keep follow up as scheduled .  IAsher Muir, CMA, am acting as scribe for Armida Sans, MD.  Documentation: I have reviewed the above documentation for accuracy and completeness, and I agree with the above.  Armida Sans, MD

## 2023-05-02 ENCOUNTER — Encounter: Payer: Self-pay | Admitting: Dermatology

## 2023-05-03 ENCOUNTER — Telehealth: Payer: Self-pay

## 2023-05-03 NOTE — Telephone Encounter (Signed)
Patient called discussed we reviewed lab from 04/13/2023 which showed some mildly elevated Liver tests. He can try the topical Ketoconazole cream first. If that is not improving things, we will send in Rx for oral Diflucan. He can send MyChart message in 2 weeks to let us know how doing.  Patient report has been using Ketoconazole cream for 1 week now and so far it seems to be helping.

## 2023-05-03 NOTE — Telephone Encounter (Addendum)
Tried calling patient regarding provider's recommendations. No answer. LMOM for patient to return call.      ----- Message from Armida Sans sent at 05/02/2023  8:08 PM EDT ----- I reviewed this pts lab from 04/13/2023 which showed some mildly elevated Liver tests. He can try the topical Ketoconazole cream first. If that is not improving things, we will send in Rx for oral Diflucan. He can send MyChart message in 2 weeks to let us know how doing.  Please call pt and advise above and document this. You may copy and paste this in your note. thanks

## 2023-05-04 ENCOUNTER — Ambulatory Visit: Payer: Self-pay | Admitting: Urology

## 2023-05-07 ENCOUNTER — Other Ambulatory Visit: Payer: Self-pay | Admitting: Neurology

## 2023-05-07 DIAGNOSIS — G20A1 Parkinson's disease without dyskinesia, without mention of fluctuations: Secondary | ICD-10-CM

## 2023-05-24 ENCOUNTER — Ambulatory Visit
Admission: RE | Admit: 2023-05-24 | Discharge: 2023-05-24 | Disposition: A | Discharge status: Home / Self Care | Payer: BC Managed Care – PPO | Source: Ambulatory Visit | Attending: Neurology | Admitting: Neurology

## 2023-05-24 DIAGNOSIS — G20A1 Parkinson's disease without dyskinesia, without mention of fluctuations: Secondary | ICD-10-CM

## 2023-05-24 MED ORDER — GADOPICLENOL 0.5 MMOL/ML IV SOLN
10.0000 mL | Freq: Once | INTRAVENOUS | Status: AC | PRN
  Administered 2023-05-24: 10 mL via INTRAVENOUS

## 2023-06-30 ENCOUNTER — Ambulatory Visit: Payer: BC Managed Care – PPO | Admitting: Dermatology

## 2023-06-30 ENCOUNTER — Encounter: Payer: Self-pay | Admitting: Dermatology

## 2023-06-30 DIAGNOSIS — L814 Other melanin hyperpigmentation: Secondary | ICD-10-CM

## 2023-06-30 DIAGNOSIS — Z1283 Encounter for screening for malignant neoplasm of skin: Secondary | ICD-10-CM | POA: Diagnosis not present

## 2023-06-30 DIAGNOSIS — Z79899 Other long term (current) drug therapy: Secondary | ICD-10-CM

## 2023-06-30 DIAGNOSIS — D170 Benign lipomatous neoplasm of skin and subcutaneous tissue of head, face and neck: Secondary | ICD-10-CM

## 2023-06-30 DIAGNOSIS — L821 Other seborrheic keratosis: Secondary | ICD-10-CM

## 2023-06-30 DIAGNOSIS — L711 Rhinophyma: Secondary | ICD-10-CM

## 2023-06-30 DIAGNOSIS — B351 Tinea unguium: Secondary | ICD-10-CM

## 2023-06-30 DIAGNOSIS — B353 Tinea pedis: Secondary | ICD-10-CM | POA: Diagnosis not present

## 2023-06-30 DIAGNOSIS — L719 Rosacea, unspecified: Secondary | ICD-10-CM

## 2023-06-30 DIAGNOSIS — L578 Other skin changes due to chronic exposure to nonionizing radiation: Secondary | ICD-10-CM

## 2023-06-30 DIAGNOSIS — Z86018 Personal history of other benign neoplasm: Secondary | ICD-10-CM

## 2023-06-30 DIAGNOSIS — Z87438 Personal history of other diseases of male genital organs: Secondary | ICD-10-CM

## 2023-06-30 DIAGNOSIS — W908XXA Exposure to other nonionizing radiation, initial encounter: Secondary | ICD-10-CM

## 2023-06-30 DIAGNOSIS — D1801 Hemangioma of skin and subcutaneous tissue: Secondary | ICD-10-CM

## 2023-06-30 DIAGNOSIS — L237 Allergic contact dermatitis due to plants, except food: Secondary | ICD-10-CM

## 2023-06-30 DIAGNOSIS — D229 Melanocytic nevi, unspecified: Secondary | ICD-10-CM

## 2023-06-30 DIAGNOSIS — Z7189 Other specified counseling: Secondary | ICD-10-CM

## 2023-06-30 MED ORDER — KETOCONAZOLE 2 % EX CREA: TOPICAL_CREAM | CUTANEOUS | 11 refills | Status: DC

## 2023-06-30 MED ORDER — CLOBETASOL PROPIONATE 0.05 % EX CREA: TOPICAL_CREAM | CUTANEOUS | 2 refills | Status: AC

## 2023-06-30 MED ORDER — TAVABOROLE 5 % EX SOLN: CUTANEOUS | 6 refills | Status: DC

## 2023-06-30 NOTE — Patient Instructions (Signed)

## 2023-06-30 NOTE — Progress Notes (Signed)
Follow-Up Visit   Subjective  Roy Austin is a 62 y.o. male who presents for the following: Skin Cancer Screening and Full Body Skin Exam, hx of Dysplastic nevus. Patient c/o poison ivy on his arms x 3 days using otc Tecnu with a poor response.    The patient presents for Total-Body Skin Exam (TBSE) for skin cancer screening and mole check. The patient has spots, moles and lesions to be evaluated, some may be new or changing and the patient may have concern these could be cancer.  wife is with patient and contributes to history.   The following portions of the chart were reviewed this encounter and updated as appropriate: medications, allergies, medical history  Review of Systems:  No other skin or systemic complaints except as noted in HPI or Assessment and Plan.  Objective  Well appearing patient in no apparent distress; mood and affect are within normal limits.  A full examination was performed including scalp, head, eyes, ears, nose, lips, neck, chest, axillae, abdomen, back, buttocks, bilateral upper extremities, bilateral lower extremities, hands, feet, fingers, toes, fingernails, and toenails. All findings within normal limits unless otherwise noted below.   Relevant physical exam findings are noted in the Assessment and Plan.    Assessment & Plan   SKIN CANCER SCREENING PERFORMED TODAY.  ACTINIC DAMAGE - Chronic condition, secondary to cumulative UV/sun exposure - diffuse scaly erythematous macules with underlying dyspigmentation - Recommend daily broad spectrum sunscreen SPF 30+ to sun-exposed areas, reapply every 2 hours as needed.  - Staying in the shade or wearing long sleeves, sun glasses (UVA+UVB protection) and wide brim hats (4-inch brim around the entire circumference of the hat) are also recommended for sun protection.  - Call for new or changing lesions.  LENTIGINES, SEBORRHEIC KERATOSES, HEMANGIOMAS - Benign normal skin lesions - Benign-appearing -  Call for any changes  MELANOCYTIC NEVI - Tan-brown and/or pink-flesh-colored symmetric macules and papules - Benign appearing on exam today - Observation - Call clinic for new or changing moles - Recommend daily use of broad spectrum spf 30+ sunscreen to sun-exposed areas.   Lipoma - minimally recurrent Excised in past Exam:  3 x 1.5 cm rubbery nodule at right posterior lateral scalp  Treatment Plan: Skin surface looks normal  Benign. Observe.  Do not recommend any treatment at this time.   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 Condition is bothersome/symptomatic for patient. Currently flared. Rosacea is a chronic progressive skin condition usually affecting the face of adults, causing redness and/or acne bumps. It is treatable but not curable. It sometimes affects the eyes (ocular rosacea) as well. It may respond to topical and/or systemic medication and can flare with stress, sun exposure, alcohol, exercise, topical steroids (including hydrocortisone/cortisone 10) and some foods.  Daily application of broad spectrum spf 30+ sunscreen to face is recommended to reduce flares.   Treatment Plan Continue Skin Medicinals metronidazole/ivermectin/azelaic acid twice daily as needed to affected areas on the face. The patient was advised this is not covered by insurance since it is made by a compounding pharmacy. They will receive an email to check out and the medication will be mailed to their home.    ALLERGIC CONTACT DERMATITIS Exam: linear scaly pink papules and/or plaques +/- vesiculation on the arms   Secondary to poison ivy or poison oak exposure  Treatment Plan: Start Clobetasol .05% cream  apply to affected skin qd-bid prn Topical steroids (such as triamcinolone, fluocinolone, fluocinonide, mometasone, clobetasol, halobetasol, betamethasone, hydrocortisone) can cause  thinning and lightening of the skin if they are used for too long in the same area. Your physician has selected the right strength medicine for your problem and area affected on the body. Please use your medication only as directed by your physician to prevent side effects.     History Balanitis with Candida  Exam: clear skin per pt  Right corona of glans penis     Treatment Plan: Benign. Observe Restart medication if condition recurs. Associated with yeast Pt with History of recurrent thrush  Reports history of elevated LFTs  HISTORY OF DYSPLASTIC NEVUS Mid back Left LQA No evidence of recurrence today Recommend regular full body skin exams Recommend daily broad spectrum sunscreen SPF 30+ to sun-exposed areas, reapply every 2 hours as needed.  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 Condition is symptomatic / bothersome to patient. Not to goal. Start: Ketoconazole 2% cream qhs to feet,  Start kerydin solution qhs to nails   Actinic skin damage  Tinea pedis of both feet  Related Medications ketoconazole (NIZORAL) 2 % cream Apply to feet at bedtime  Skin cancer screening  Lentigo  Melanocytic nevus, unspecified location  History of dysplastic nevus  Rosacea  Rhinophyma  Counseling and coordination of care  Medication management  Allergic contact dermatitis due to plants, except food  History of balanitis   Return in about 1 year (around 06/29/2024) for TBSE, hx of Dysplastic nevus .  IAngelique Holm, CMA, am acting as scribe for Armida Sans, MD .   Documentation: I have reviewed the above documentation for accuracy and completeness, and I agree with the above.  Armida Sans, MD

## 2023-07-13 ENCOUNTER — Telehealth: Payer: Self-pay

## 2023-07-13 MED ORDER — CICLOPIROX 8 % EX SOLN: Freq: Every day | CUTANEOUS | 0 refills | Status: DC

## 2023-07-13 NOTE — Telephone Encounter (Signed)
Patient's Tavaborole 5% denied by insurance due to: 1) Your infection does not involve a thick fungal mass within and under the nail or root of the nail.  2) You did not respond fully or cannot tolerate both oral generic terbinafine and oral generic itraconazole or topical generic ciclopirox.

## 2023-07-13 NOTE — Telephone Encounter (Signed)
Ciclopirox sent in and patient advised. aw

## 2024-02-04 ENCOUNTER — Encounter: Payer: Self-pay | Admitting: Urology

## 2024-03-21 ENCOUNTER — Ambulatory Visit
Admission: EM | Admit: 2024-03-21 | Discharge: 2024-03-21 | Disposition: A | Discharge status: Home / Self Care | Attending: Emergency Medicine | Admitting: Emergency Medicine

## 2024-03-21 ENCOUNTER — Ambulatory Visit: Payer: Self-pay | Admitting: Emergency Medicine

## 2024-03-21 ENCOUNTER — Ambulatory Visit (INDEPENDENT_AMBULATORY_CARE_PROVIDER_SITE_OTHER)

## 2024-03-21 DIAGNOSIS — R051 Acute cough: Secondary | ICD-10-CM

## 2024-03-21 DIAGNOSIS — Z20822 Contact with and (suspected) exposure to covid-19: Secondary | ICD-10-CM | POA: Diagnosis present

## 2024-03-21 DIAGNOSIS — J019 Acute sinusitis, unspecified: Secondary | ICD-10-CM

## 2024-03-21 DIAGNOSIS — J22 Unspecified acute lower respiratory infection: Secondary | ICD-10-CM | POA: Insufficient documentation

## 2024-03-21 LAB — CBC WITH DIFFERENTIAL/PLATELET
Abs Immature Granulocytes: 0.02 10*3/uL (ref 0.00–0.07)
Basophils Absolute: 0 10*3/uL (ref 0.0–0.1)
Basophils Relative: 0 %
Eosinophils Absolute: 0.1 10*3/uL (ref 0.0–0.5)
Eosinophils Relative: 1 %
HCT: 52.6 % — ABNORMAL HIGH (ref 39.0–52.0)
Hemoglobin: 18 g/dL — ABNORMAL HIGH (ref 13.0–17.0)
Immature Granulocytes: 0 %
Lymphocytes Relative: 19 %
Lymphs Abs: 1.1 10*3/uL (ref 0.7–4.0)
MCH: 34 pg (ref 26.0–34.0)
MCHC: 34.2 g/dL (ref 30.0–36.0)
MCV: 99.4 fL (ref 80.0–100.0)
Monocytes Absolute: 0.6 10*3/uL (ref 0.1–1.0)
Monocytes Relative: 10 %
Neutro Abs: 4 10*3/uL (ref 1.7–7.7)
Neutrophils Relative %: 70 %
Platelets: 135 10*3/uL — ABNORMAL LOW (ref 150–400)
RBC: 5.29 MIL/uL (ref 4.22–5.81)
RDW: 12 % (ref 11.5–15.5)
WBC: 5.7 10*3/uL (ref 4.0–10.5)
nRBC: 0 % (ref 0.0–0.2)

## 2024-03-21 LAB — BASIC METABOLIC PANEL WITH GFR
Anion gap: 9 (ref 5–15)
BUN: 12 mg/dL (ref 8–23)
CO2: 25 mmol/L (ref 22–32)
Calcium: 9.1 mg/dL (ref 8.9–10.3)
Chloride: 102 mmol/L (ref 98–111)
Creatinine, Ser: 1.49 mg/dL — ABNORMAL HIGH (ref 0.61–1.24)
GFR, Estimated: 53 mL/min — ABNORMAL LOW (ref >60)
Glucose, Bld: 117 mg/dL — ABNORMAL HIGH (ref 70–99)
Potassium: 3.8 mmol/L (ref 3.5–5.1)
Sodium: 136 mmol/L (ref 135–145)

## 2024-03-21 LAB — RESP PANEL BY RT-PCR (FLU A&B, COVID) ARPGX2
Influenza A by PCR: NEGATIVE
Influenza B by PCR: NEGATIVE
SARS Coronavirus 2 by RT PCR: NEGATIVE

## 2024-03-21 MED ORDER — ALBUTEROL SULFATE HFA 108 (90 BASE) MCG/ACT IN AERS: 1.0000 | INHALATION_SPRAY | RESPIRATORY_TRACT | 0 refills | Status: AC | PRN

## 2024-03-21 MED ORDER — PREDNISONE 20 MG PO TABS: 40.0000 mg | ORAL_TABLET | Freq: Every day | ORAL | 0 refills | Status: AC

## 2024-03-21 MED ORDER — PROMETHAZINE-DM 6.25-15 MG/5ML PO SYRP: 5.0000 mL | ORAL_SOLUTION | Freq: Four times a day (QID) | ORAL | 0 refills | Status: AC | PRN

## 2024-03-21 MED ORDER — FLUCONAZOLE 150 MG PO TABS: 150.0000 mg | ORAL_TABLET | Freq: Once | ORAL | 1 refills | Status: AC

## 2024-03-21 MED ORDER — AEROCHAMBER MV MISC
1 refills | Status: AC
Start: 2024-03-21 — End: ?

## 2024-03-21 MED ORDER — AMOXICILLIN-POT CLAVULANATE 875-125 MG PO TABS: 1.0000 | ORAL_TABLET | Freq: Two times a day (BID) | ORAL | 0 refills | Status: AC

## 2024-03-21 MED ORDER — IPRATROPIUM-ALBUTEROL 0.5-2.5 (3) MG/3ML IN SOLN
3.0000 mL | Freq: Once | RESPIRATORY_TRACT | Status: AC
  Administered 2024-03-21: 3 mL via RESPIRATORY_TRACT

## 2024-03-21 MED ORDER — ALBUTEROL SULFATE (2.5 MG/3ML) 0.083% IN NEBU
2.5000 mg | INHALATION_SOLUTION | Freq: Once | RESPIRATORY_TRACT | Status: AC
  Administered 2024-03-21: 2.5 mg via RESPIRATORY_TRACT

## 2024-03-21 MED ORDER — FLUTICASONE PROPIONATE 50 MCG/ACT NA SUSP: 2.0000 | Freq: Every day | NASAL | 0 refills | Status: AC

## 2024-03-21 NOTE — ED Triage Notes (Signed)
 Sx since Friday  Productive cough with clear mucus. That causing pain when coughing Headache Wheezing SOB

## 2024-03-21 NOTE — Discharge Instructions (Addendum)
 Your kidney function is at its baseline.  Your CBC is unremarkable.  COVID, flu negative.  Your chest x-ray is negative for pneumonia  Finish the antibiotic and steroids, even if you feel better. Take two puffs from your albuterol inhaler with your spacer every 4 hours for 2 days, then every 6 hours for 2 days, then as needed. You can back off if you start to improve  sooner. Take tylenol 1 gram up to 3-4 times a day as needed for pain. Make sure you drink extra fluids. Return to the ER if you get worse, have a fever >100.4, or any other concerns.   If the spacer is too expensive at the pharmacy, you can get an AeroChamber Z-Stat off of [employer] for about $10-$15  Start Mucinex to keep the mucous thin and to decongest you.   Flonase.. Use a NeilMed sinus rinse with distilled water as often as you want to to reduce nasal congestion. Follow the directions on the box.   Go to www.goodrx.com  or www.costplusdrugs.com to look up your medications. This will give you a list of where you can find your prescriptions at the most affordable prices. Or ask the pharmacist what the cash price is, or if they have any other discount programs available to help make your medication more affordable. This can be less expensive than what you would pay with insurance.

## 2024-03-21 NOTE — ED Provider Notes (Signed)
 HPI  SUBJECTIVE:  Roy Austin is a 63 y.o. male who presents with 5 days of chest congestion that has moved to my head.  He reports crackles, wheezing, diffuse chest soreness secondary to the cough, shortness of breath, dyspnea on exertion, fatigue, body aches, rhinorrhea, sinus pain and pressure, upper dental pain, facial swelling, postnasal drip, nonproductive cough.  He reports diarrhea yesterday.  He is unable to sleep at night because of the cough.  No nausea, vomiting, abdominal pain, fevers, nasal congestion.  No unintentional weight gain, lower extremity edema, orthopnea.  He reports PND.  He took Tylenol 1000 mg, moonshine and hot lemon tea with improvement in his symptoms.  Symptoms are worse with exertion.  Last dose of Tylenol was within 6 hours of evaluation.  No known COVID, flu, RSV exposure.  He did not get the COVID or flu vaccines.  He has a past medical history of Parkinson's, OSA on CPAP, status post uvulectomy/soft palate resection, well-controlled diabetes, hypertension, exercise-induced asthma, chronic kidney disease stage IIIa, fatty liver disease, depression, oral thrush.  He is no longer on lithium.  No history of coronary disease, CHF, smoking.  [facility]   Past Medical History:  Diagnosis Date   Dysplastic nevus 07/18/2009   mid back - moderate, excision 08/20/2009   Dysplastic nevus 12/04/2020   left LQA - moderate    History reviewed. No pertinent surgical history.  History reviewed. No pertinent family history.  Social History   Tobacco Use   Smoking status: Never   Smokeless tobacco: Former  Building services engineer status: Never Used    No current facility-administered medications for this encounter.  Current Outpatient Medications:    albuterol (VENTOLIN HFA) 108 (90 Base) MCG/ACT inhaler, Inhale 1-2 puffs into the lungs every 4 (four) hours as needed for wheezing or shortness of breath., Disp: 1 each, Rfl: 0    amoxicillin-clavulanate (AUGMENTIN) 875-125 MG tablet, Take 1 tablet by mouth every 12 (twelve) hours., Disp: 14 tablet, Rfl: 0   buPROPion (WELLBUTRIN SR) 200 MG 12 hr tablet, Take 1 tablet by mouth 2 (two) times daily., Disp: , Rfl:    carbidopa-levodopa (SINEMET IR) 25-100 MG tablet, Take 1 tablet by mouth 3 (three) times daily., Disp: , Rfl:    fluconazole  (DIFLUCAN ) 150 MG tablet, Take 1 tablet (150 mg total) by mouth once for 1 dose. 1 tab po x 1. May repeat in 72 hours if no improvement, Disp: 2 tablet, Rfl: 1   fluticasone (FLONASE) 50 MCG/ACT nasal spray, Place 2 sprays into both nostrils daily., Disp: 16 g, Rfl: 0   lithium carbonate (LITHOBID) 300 MG ER tablet, TK 3 TS PO QD, Disp: , Rfl:    predniSONE (DELTASONE) 20 MG tablet, Take 2 tablets (40 mg total) by mouth daily with breakfast for 5 days., Disp: 10 tablet, Rfl: 0   promethazine-dextromethorphan (PROMETHAZINE-DM) 6.25-15 MG/5ML syrup, Take 5 mLs by mouth 4 (four) times daily as needed for cough., Disp: 118 mL, Rfl: 0   propranolol (INDERAL) 20 MG tablet, Take by mouth., Disp: , Rfl:    rivastigmine (EXELON) 1.5 MG capsule, Take 1.5 mg by mouth 2 (two) times daily., Disp: , Rfl:    Spacer/Aero-Holding Chambers (AEROCHAMBER MV) inhaler, Use as instructed, Disp: 1 each, Rfl: 1   Testosterone Enanthate (XYOSTED) 75 MG/0.5ML SOAJ, Inject 75 mg into the skin., Disp: , Rfl:    ciclopirox  (PENLAC ) 8 % solution, Apply topically at bedtime. Apply over nail and surrounding  skin. Apply daily over previous coat. After seven (7) days, may remove with alcohol and continue cycle., Disp: 6.6 mL, Rfl: 0   clobetasol  cream (TEMOVATE ) 0.05 %, Apply to affected skin qd-bid prn, Avoid applying to face, groin, and axilla. Use as directed. Long-term use can cause thinning of the skin., Disp: 60 g, Rfl: 2   ketoconazole  (NIZORAL ) 2 % cream, Apply topically to affected area of penis for rash qd 3 days weekly on t- thur, Saturday, Disp: 30 g, Rfl: 2    ketoconazole  (NIZORAL ) 2 % cream, Apply to feet at bedtime, Disp: 60 g, Rfl: 11   mometasone  (ELOCON ) 0.1 % cream, Apply 1 Application topically daily. Apply topically to affected area of rash at penis  daily until clear 3 days weekly on M-W-F, Disp: 45 g, Rfl: 2   risperiDONE (RISPERDAL) 0.5 MG tablet, Take 0.5 mg by mouth., Disp: , Rfl:    Tavaborole  5 % SOLN, Apply to toenails once a day, Disp: 4 mL, Rfl: 6  Allergies  Allergen Reactions   Olanzapine Swelling and Anxiety    agitation  agitation  agitation  agitation   Citalopram Other (See Comments)     ROS  As noted in HPI.   Physical Exam  BP (!) 140/98 (BP Location: Left Arm)   Pulse 75   Temp 98.6 F (37 C) (Oral)   Resp 16   SpO2 97%   Constitutional: Well developed, well nourished, no acute distress.  Coughing. Eyes: PERRL, EOMI, conjunctiva normal bilaterally HENT: Normocephalic, atraumatic,mucus membranes moist.  No nasal congestion.  Normal turbinates.  No maxillary, frontal sinus tenderness.  Positive swelling under eyes.  Uvula absent.  No obvious postnasal drip. Neck: Positive cervical lymphadenopathy Respiratory: Fair air movement, diffuse expiratory wheezing, rhonchi.  No anterior, lateral chest wall tenderness Cardiovascular: Normal rate and rhythm, no murmurs, no gallops, no rubs GI: nondistended skin: No rash, skin intact Musculoskeletal: Calves symmetric, nontender, no edema Neurologic: Alert & oriented x 3, CN III-XII grossly intact, no motor deficits, sensation grossly intact Psychiatric: Speech and behavior appropriate   ED Course   Medications  albuterol (PROVENTIL) (2.5 MG/3ML) 0.083% nebulizer solution 2.5 mg (2.5 mg Nebulization Given 03/21/24 1015)  ipratropium-albuterol (DUONEB) 0.5-2.5 (3) MG/3ML nebulizer solution 3 mL (3 mLs Nebulization Given 03/21/24 1015)    Orders Placed This Encounter  Procedures   Resp Panel by RT-PCR (Flu A&B, Covid) Anterior Nasal Swab    Standing Status:    Standing    Number of Occurrences:   1    Patient immune status:   Normal    Release to patient:   Immediate [1]   DG Chest 2 View    Standing Status:   Standing    Number of Occurrences:   1    Reason for Exam (SYMPTOM  OR DIAGNOSIS REQUIRED):   cough wheeze 5 days r/o PNA, pulmonary edema, effusion   CBC with Differential    Standing Status:   Standing    Number of Occurrences:   1   Basic metabolic panel    Standing Status:   Standing    Number of Occurrences:   1   Results for orders placed or performed during the hospital encounter of 03/21/24 (from the past 24 hours)  Resp Panel by RT-PCR (Flu A&B, Covid) Anterior Nasal Swab     Status: None   Collection Time: 03/21/24  9:27 AM   Specimen: Anterior Nasal Swab  Result Value Ref Range   SARS Coronavirus  2 by RT PCR NEGATIVE NEGATIVE   Influenza A by PCR NEGATIVE NEGATIVE   Influenza B by PCR NEGATIVE NEGATIVE  CBC with Differential     Status: Abnormal   Collection Time: 03/21/24 10:10 AM  Result Value Ref Range   WBC 5.7 4.0 - 10.5 K/uL   RBC 5.29 4.22 - 5.81 MIL/uL   Hemoglobin 18.0 (H) 13.0 - 17.0 g/dL   HCT 13.0 (H) 86.5 - 78.4 %   MCV 99.4 80.0 - 100.0 fL   MCH 34.0 26.0 - 34.0 pg   MCHC 34.2 30.0 - 36.0 g/dL   RDW 69.6 29.5 - 28.4 %   Platelets 135 (L) 150 - 400 K/uL   nRBC 0.0 0.0 - 0.2 %   Neutrophils Relative % 70 %   Neutro Abs 4.0 1.7 - 7.7 K/uL   Lymphocytes Relative 19 %   Lymphs Abs 1.1 0.7 - 4.0 K/uL   Monocytes Relative 10 %   Monocytes Absolute 0.6 0.1 - 1.0 K/uL   Eosinophils Relative 1 %   Eosinophils Absolute 0.1 0.0 - 0.5 K/uL   Basophils Relative 0 %   Basophils Absolute 0.0 0.0 - 0.1 K/uL   Immature Granulocytes 0 %   Abs Immature Granulocytes 0.02 0.00 - 0.07 K/uL  Basic metabolic panel     Status: Abnormal   Collection Time: 03/21/24 10:10 AM  Result Value Ref Range   Sodium 136 135 - 145 mmol/L   Potassium 3.8 3.5 - 5.1 mmol/L   Chloride 102 98 - 111 mmol/L   CO2 25 22 - 32 mmol/L    Glucose, Bld 117 (H) 70 - 99 mg/dL   BUN 12 8 - 23 mg/dL   Creatinine, Ser 1.32 (H) 0.61 - 1.24 mg/dL   Calcium 9.1 8.9 - 44.0 mg/dL   GFR, Estimated 53 (L) >60 mL/min   Anion gap 9 5 - 15   DG Chest 2 View Result Date: 03/21/2024 CLINICAL DATA:  Cough and wheezing for 5 days. Evaluate for pulmonary edema, pneumonia and effusion. EXAM: CHEST - 2 VIEW COMPARISON:  Report only from chest radiographs 02/13/2016. FINDINGS: The heart size and mediastinal contours are normal. The lungs are clear. There is no pleural effusion or pneumothorax. No acute osseous findings are identified. Mild spondylosis noted. IMPRESSION: No evidence of acute cardiopulmonary process. Electronically Signed   By: Elmon Hagedorn M.D.   On: 03/21/2024 11:23    ED Clinical Impression  1. Lower respiratory tract infection   2. Acute cough   3. Acute non-recurrent sinusitis, unspecified location   4. Lab test negative for COVID-19 virus      ED Assessment/Plan     Concern for sinusitis at the very least, but also concern for pneumonia.  Doubt ACS, CHF.  He does not appear to be fluid overloaded.  COVID, flu, RSV, basic metabolic panel for kidney function, CBC, chest x-ray to evaluate for pneumonia, pulmonary edema, pleural effusion.  Will give DuoNeb 5/0.5 mg and reevaluate.  Labs reviewed.  No leukocytosis.  Patient is hemoconcentrated.  No leukocytosis.  Creatinine 1.49, previously 1.4 on labs done from 04/28/2023.  COVID, flu negative.  Calculated creatinine clearance based on labs done today 80 mL/min.  Reviewed imaging independently.  No acute cardiopulmonary disease per radiology.  See radiology report for full details.  On reevaluation, patient states that he feels better post nebulizer treatment.  Repeat exam improved air movement, still with expiratory wheezing and rhonchi throughout all lung fields.  Discussed all  labs, imaging with him.  Presentation consistent with sinusitis and acute lower respiratory  tract infection.  He qualifies for antibiotics due to severity of symptoms.  Plan to send home with  Augmentin 875 mg p.o. twice daily for 7 days.  This will also cover both sinusitis and any possible pneumonia that is not showing up on x-ray.  Promethazine DM, Tylenol 1000 mg 3-4 times a day as needed.  Regularly scheduled albuterol inhaler with a spacer for 4 days, then as needed thereafter, saline nasal irrigation, plain Mucinex, Flonase, and is on 40 mg for 5 days.  Will prescribe Diflucan  as he gets frequent yeast infections with antibiotic treatment.  Follow-up with PCP in several days if not getting better.  He is to go to the ED if he gets worse.  Discussed labs, imaging, MDM, treatment plan, and plan for follow-up with patient Discussed sn/sx that should prompt return to the ED. patient agrees with plan.   Meds ordered this encounter  Medications   albuterol (PROVENTIL) (2.5 MG/3ML) 0.083% nebulizer solution 2.5 mg   ipratropium-albuterol (DUONEB) 0.5-2.5 (3) MG/3ML nebulizer solution 3 mL   fluconazole  (DIFLUCAN ) 150 MG tablet    Sig: Take 1 tablet (150 mg total) by mouth once for 1 dose. 1 tab po x 1. May repeat in 72 hours if no improvement    Dispense:  2 tablet    Refill:  1   albuterol (VENTOLIN HFA) 108 (90 Base) MCG/ACT inhaler    Sig: Inhale 1-2 puffs into the lungs every 4 (four) hours as needed for wheezing or shortness of breath.    Dispense:  1 each    Refill:  0   promethazine-dextromethorphan (PROMETHAZINE-DM) 6.25-15 MG/5ML syrup    Sig: Take 5 mLs by mouth 4 (four) times daily as needed for cough.    Dispense:  118 mL    Refill:  0   Spacer/Aero-Holding Chambers (AEROCHAMBER MV) inhaler    Sig: Use as instructed    Dispense:  1 each    Refill:  1   predniSONE (DELTASONE) 20 MG tablet    Sig: Take 2 tablets (40 mg total) by mouth daily with breakfast for 5 days.    Dispense:  10 tablet    Refill:  0   fluticasone (FLONASE) 50 MCG/ACT nasal spray    Sig: Place 2  sprays into both nostrils daily.    Dispense:  16 g    Refill:  0   amoxicillin-clavulanate (AUGMENTIN) 875-125 MG tablet    Sig: Take 1 tablet by mouth every 12 (twelve) hours.    Dispense:  14 tablet    Refill:  0      *This clinic note was created using Scientist, clinical (histocompatibility and immunogenetics). Therefore, there may be occasional mistakes despite careful proofreading. ?    Ethlyn Herd, MD 03/21/24 1135

## 2024-04-14 ENCOUNTER — Other Ambulatory Visit

## 2024-04-14 DIAGNOSIS — Z125 Encounter for screening for malignant neoplasm of prostate: Secondary | ICD-10-CM

## 2024-04-15 LAB — PSA: Prostate Specific Ag, Serum: 2.3 ng/mL (ref 0.0–4.0)

## 2024-04-17 ENCOUNTER — Ambulatory Visit: Admitting: Urology

## 2024-04-17 ENCOUNTER — Other Ambulatory Visit: Payer: Self-pay

## 2024-04-17 ENCOUNTER — Encounter: Payer: Self-pay | Admitting: Urology

## 2024-04-17 VITALS — BP 134/94 | HR 61 | Ht 68.0 in | Wt 230.0 lb

## 2024-04-17 DIAGNOSIS — N401 Enlarged prostate with lower urinary tract symptoms: Secondary | ICD-10-CM | POA: Diagnosis not present

## 2024-04-17 DIAGNOSIS — Z125 Encounter for screening for malignant neoplasm of prostate: Secondary | ICD-10-CM | POA: Diagnosis not present

## 2024-04-17 DIAGNOSIS — A6000 Herpesviral infection of urogenital system, unspecified: Secondary | ICD-10-CM | POA: Diagnosis not present

## 2024-04-17 MED ORDER — VALACYCLOVIR HCL 1 G PO TABS: 1000.0000 mg | ORAL_TABLET | Freq: Every day | ORAL | 3 refills | Status: DC

## 2024-04-17 NOTE — Progress Notes (Signed)
 04/17/2024 10:44 AM   Roy Austin May 05, 1961 [id number]  Referring provider: No referring provider defined for this encounter.  Chief Complaint  Patient presents with   Screening PSA    HPI: Roy Austin is a 63 y.o. presents for annual follow-up  Since last year's visit he has been diagnosed with Parkinson's He has noted some worsening lower urinary tract symptoms including frequency, urgency, occasional incontinence and decreased force and caliber of his urinary stream Present voiding symptoms not bothersome enough that he desires medical management He has noted increased genital herpes outbreaks at ~once per month related to stress and would like to get on a suppressive dose of Valtrex  PSA 04/14/2024 it was 2.3 (1.2 last year)    PMH: Past Medical History:  Diagnosis Date   Dysplastic nevus 07/18/2009   mid back - moderate, excision 08/20/2009   Dysplastic nevus 12/04/2020   left LQA - moderate     Home Medications:  Allergies as of 04/17/2024       Reactions   Olanzapine Swelling, Anxiety   agitation  agitation  agitation agitation   Citalopram Other (See Comments)        Medication List        Accurate as of April 17, 2024 10:44 AM. If you have any questions, ask your nurse or doctor.          AeroChamber MV inhaler Use as instructed   albuterol  108 (90 Base) MCG/ACT inhaler Commonly known as: VENTOLIN  HFA Inhale 1-2 puffs into the lungs every 4 (four) hours as needed for wheezing or shortness of breath.   amoxicillin -clavulanate 875-125 MG tablet Commonly known as: AUGMENTIN  Take 1 tablet by mouth every 12 (twelve) hours.   buPROPion 200 MG 12 hr tablet Commonly known as: WELLBUTRIN SR Take 1 tablet by mouth 2 (two) times daily.   carbidopa-levodopa 25-100 MG tablet Commonly known as: SINEMET IR Take 1 tablet by mouth 3 (three) times daily.   ciclopirox  8 % solution Commonly known as: PENLAC  Apply topically at bedtime.  Apply over nail and surrounding skin. Apply daily over previous coat. After seven (7) days, may remove with alcohol and continue cycle.   clobetasol  cream 0.05 % Commonly known as: TEMOVATE  Apply to affected skin qd-bid prn, Avoid applying to face, groin, and axilla. Use as directed. Long-term use can cause thinning of the skin.   fluticasone  50 MCG/ACT nasal spray Commonly known as: FLONASE  Place 2 sprays into both nostrils daily.   ketoconazole  2 % cream Commonly known as: NIZORAL  Apply topically to affected area of penis for rash qd 3 days weekly on t- thur, Saturday   ketoconazole  2 % cream Commonly known as: NIZORAL  Apply to feet at bedtime   lithium carbonate 300 MG ER tablet Commonly known as: LITHOBID TK 3 TS PO QD   mometasone  0.1 % cream Commonly known as: ELOCON  Apply 1 Application topically daily. Apply topically to affected area of rash at penis  daily until clear 3 days weekly on M-W-F   promethazine -dextromethorphan 6.25-15 MG/5ML syrup Commonly known as: PROMETHAZINE -DM Take 5 mLs by mouth 4 (four) times daily as needed for cough.   propranolol 20 MG tablet Commonly known as: INDERAL Take by mouth.   risperiDONE 0.5 MG tablet Commonly known as: RISPERDAL Take 0.5 mg by mouth.   rivastigmine 1.5 MG capsule Commonly known as: EXELON Take 1.5 mg by mouth 2 (two) times daily.   Tavaborole  5 % Soln Apply to toenails once a  day   Xyosted 75 MG/0.5ML Soaj Generic drug: Testosterone Enanthate Inject 75 mg into the skin.         Physical Exam: BP (!) 134/94   Pulse 61   Ht 5' 8 (1.727 m)   Wt 230 lb (104.3 kg)   BMI 34.97 kg/m   Constitutional:  Alert and oriented, No acute distress. HEENT: [place] AT Respiratory: Normal respiratory effort, no increased work of breathing. GU: Prostate 45 g, soft, smooth without nodules. Psychiatric: Normal mood and affect.   Assessment & Plan:    1.  Lower urinary tract symptoms Worsening LUTS with several  potential etiologies including BPH, Parkinson's and Jardiance Symptoms not bothersome enough that he desires medical management  2. Prostate cancer screening Benign DRE. PSA higher from last year and increase was >0.7 Recommend follow-up lab visit 6 weeks for repeat If PSA back to baseline he will continue annual follow-up If repeat value represents an abnormal PSA velocity will schedule prostate MRI   3.  Genital herpes Recent increase outbreaks Start suppressive dose Valtrex   Continue annual follow-up   Glendia JAYSON Barba, MD  [facility] [address], [address] [place], [place] [address] [phone]

## 2024-04-21 ENCOUNTER — Ambulatory Visit: Payer: Self-pay | Admitting: Urology

## 2024-05-19 ENCOUNTER — Other Ambulatory Visit: Payer: Self-pay

## 2024-05-19 DIAGNOSIS — Z125 Encounter for screening for malignant neoplasm of prostate: Secondary | ICD-10-CM

## 2024-05-22 ENCOUNTER — Other Ambulatory Visit

## 2024-05-22 DIAGNOSIS — Z125 Encounter for screening for malignant neoplasm of prostate: Secondary | ICD-10-CM

## 2024-05-23 ENCOUNTER — Ambulatory Visit: Payer: Self-pay | Admitting: Urology

## 2024-05-23 LAB — PSA: Prostate Specific Ag, Serum: 1 ng/mL (ref 0.0–4.0)

## 2024-07-05 ENCOUNTER — Ambulatory Visit: Payer: BC Managed Care – PPO | Admitting: Dermatology

## 2024-08-14 ENCOUNTER — Ambulatory Visit: Admitting: Dermatology

## 2024-08-14 DIAGNOSIS — Z86018 Personal history of other benign neoplasm: Secondary | ICD-10-CM

## 2024-08-14 DIAGNOSIS — D225 Melanocytic nevi of trunk: Secondary | ICD-10-CM | POA: Diagnosis not present

## 2024-08-14 DIAGNOSIS — Z7189 Other specified counseling: Secondary | ICD-10-CM

## 2024-08-14 DIAGNOSIS — L719 Rosacea, unspecified: Secondary | ICD-10-CM

## 2024-08-14 DIAGNOSIS — Z1283 Encounter for screening for malignant neoplasm of skin: Secondary | ICD-10-CM | POA: Diagnosis not present

## 2024-08-14 DIAGNOSIS — L814 Other melanin hyperpigmentation: Secondary | ICD-10-CM | POA: Diagnosis not present

## 2024-08-14 DIAGNOSIS — L821 Other seborrheic keratosis: Secondary | ICD-10-CM

## 2024-08-14 DIAGNOSIS — W908XXA Exposure to other nonionizing radiation, initial encounter: Secondary | ICD-10-CM | POA: Diagnosis not present

## 2024-08-14 DIAGNOSIS — L570 Actinic keratosis: Secondary | ICD-10-CM

## 2024-08-14 DIAGNOSIS — D229 Melanocytic nevi, unspecified: Secondary | ICD-10-CM

## 2024-08-14 DIAGNOSIS — Z79899 Other long term (current) drug therapy: Secondary | ICD-10-CM

## 2024-08-14 DIAGNOSIS — D492 Neoplasm of unspecified behavior of bone, soft tissue, and skin: Secondary | ICD-10-CM | POA: Diagnosis not present

## 2024-08-14 DIAGNOSIS — B353 Tinea pedis: Secondary | ICD-10-CM

## 2024-08-14 DIAGNOSIS — L578 Other skin changes due to chronic exposure to nonionizing radiation: Secondary | ICD-10-CM | POA: Diagnosis not present

## 2024-08-14 DIAGNOSIS — B351 Tinea unguium: Secondary | ICD-10-CM

## 2024-08-14 NOTE — Patient Instructions (Addendum)

## 2024-08-14 NOTE — Progress Notes (Unsigned)
 Follow-Up Visit   Subjective  Roy Austin is a 63 y.o. male who presents for the following: Skin Cancer Screening and Full Body Skin Exam, hx of Dysplastic nevus  He also has history of toenail dystrophy. It has been present since he was in the [employer]. He has history of liver problems (hereditary cirrhosis)  Patient has tried and failed Ketoconazole  cream, Kerydin  and Jublia .  The patient presents for Total-Body Skin E exam (TBSE) for skin cancer screening and mole check. The patient has spots, moles and lesions to be evaluated, some may be new or changing and the patient may have concern these could be cancer.  Wife is with patient and contributes to history.   The following portions of the chart were reviewed this encounter and updated as appropriate: medications, allergies, medical history  Review of Systems:  No other skin or systemic complaints except as noted in HPI or Assessment and Plan.  Objective  Well appearing patient in no apparent distress; mood and affect are within normal limits.  A full examination was performed including scalp, head, eyes, ears, nose, lips, neck, chest, axillae, abdomen, back, buttocks, bilateral upper extremities, bilateral lower extremities, hands, feet, fingers, toes, fingernails, and toenails. All findings within normal limits unless otherwise noted below.   Relevant physical exam findings are noted in the Assessment and Plan.  Right Ear Erythematous thin papules/macules with gritty scale.  low back spinal 0.7 cm irregular brown macule  left mid to low back paraspinal 0.7 cm irregular brown macule        Assessment & Plan   SKIN CANCER SCREENING PERFORMED TODAY.  ACTINIC DAMAGE - Chronic condition, secondary to cumulative UV/sun exposure - diffuse scaly erythematous macules with underlying dyspigmentation - Recommend daily broad spectrum sunscreen SPF 30+ to sun-exposed areas, reapply every 2 hours as needed.  - Staying  in the shade or wearing long sleeves, sun glasses (UVA+UVB protection) and wide brim hats (4-inch brim around the entire circumference of the hat) are also recommended for sun protection.  - Call for new or changing lesions.  LENTIGINES, SEBORRHEIC KERATOSES, HEMANGIOMAS - Benign normal skin lesions - Benign-appearing - Call for any changes  MELANOCYTIC NEVI - Tan-brown and/or pink-flesh-colored symmetric macules and papules - Benign appearing on exam today - Observation - Call clinic for new or changing moles - Recommend daily use of broad spectrum spf 30+ sunscreen to sun-exposed areas.   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 Condition is bothersome/symptomatic for patient. Currently flared. Rosacea is a chronic progressive skin condition usually affecting the face of adults, causing redness and/or acne bumps. It is treatable but not curable. It sometimes affects the eyes (ocular rosacea) as well. It may respond to topical and/or systemic medication and can flare with stress, sun exposure, alcohol, exercise, topical steroids (including hydrocortisone/cortisone 10) and some foods.  Daily application of broad spectrum spf 30+ sunscreen to face is recommended to reduce flares. Treatment Plan Continue Skin Medicinals metronidazole/ivermectin/azelaic acid twice daily as needed to affected areas on the face. The patient was advised this is not covered by insurance since it is made by a compounding pharmacy. They will receive an email to check out and the medication will be mailed to their home.   HISTORY OF DYSPLASTIC NEVUS No evidence of recurrence today Recommend regular full body skin exams Recommend daily broad spectrum sunscreen SPF 30+ to  sun-exposed areas, reapply every 2 hours as needed.  Call if any new or changing lesions are noted between office visits   AK (ACTINIC  KERATOSIS) Right Ear ACTINIC DAMAGE - chronic, secondary to cumulative UV radiation exposure/sun exposure over time - diffuse scaly erythematous macules with underlying dyspigmentation - Recommend daily broad spectrum sunscreen SPF 30+ to sun-exposed areas, reapply every 2 hours as needed.  - Recommend staying in the shade or wearing long sleeves, sun glasses (UVA+UVB protection) and wide brim hats (4-inch brim around the entire circumference of the hat). - Call for new or changing lesions.  Destruction of lesion - Right Ear Complexity: simple   Destruction method: cryotherapy   Informed consent: discussed and consent obtained   Timeout:  patient name, date of birth, surgical site, and procedure verified Lesion destroyed using liquid nitrogen: Yes   Region frozen until ice ball extended beyond lesion: Yes   Outcome: patient tolerated procedure well with no complications   Post-procedure details: wound care instructions given    NEOPLASM OF SKIN (2) low back spinal Epidermal / dermal shaving  Lesion diameter (cm):  0.7 Informed consent: discussed and consent obtained   Timeout: patient name, date of birth, surgical site, and procedure verified   Procedure prep:  Patient was prepped and draped in usual sterile fashion Prep type:  Isopropyl alcohol Anesthesia: the lesion was anesthetized in a standard fashion   Anesthetic:  1% lidocaine w/ epinephrine 1-100,000 buffered w/ 8.4% NaHCO3 Hemostasis achieved with: pressure, aluminum chloride and electrodesiccation   Outcome: patient tolerated procedure well   Post-procedure details: sterile dressing applied and wound care instructions given   Dressing type: bandage and petrolatum    Specimen 1 - Surgical pathology Differential Diagnosis: R/O Dysplastic nevus   Check Margins: No left mid to low back paraspinal Epidermal / dermal shaving  Lesion diameter (cm):  0.7 Informed consent: discussed and consent obtained   Timeout: patient  name, date of birth, surgical site, and procedure verified   Procedure prep:  Patient was prepped and draped in usual sterile fashion Prep type:  Isopropyl alcohol Anesthesia: the lesion was anesthetized in a standard fashion   Anesthetic:  1% lidocaine w/ epinephrine 1-100,000 buffered w/ 8.4% NaHCO3 Hemostasis achieved with: pressure, aluminum chloride and electrodesiccation   Outcome: patient tolerated procedure well   Post-procedure details: sterile dressing applied and wound care instructions given   Dressing type: bandage and petrolatum    Specimen 2 - Surgical pathology Differential Diagnosis: R/O Dysplastic nevus   Check Margins: No  Lipoma - minimally recurrent Excised in past Exam:  3 x 1.5 cm rubbery nodule at right posterior lateral scalp  Treatment Plan: Skin surface looks normal  Benign. Observe.  Do not recommend any treatment at this time.    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 Condition is symptomatic / bothersome to patient. Not to goal.  History of liver problems (hereditary cirrhosis)  Patient has tried and failed Ketoconazole  cream, Kerydin  and Jublia . Exam: Scaling and maceration web spaces and over distal and lateral soles. Treatment Plan: Nail-Fungal-ID/Derm-ID Molecular Diagnostic test performed today.  Discussed with patient their insurance will be billed.  Advised the patient they may get a bill for a portion that's not covered by their insurance.  Should the patient have any issues with their remaining responsibility they will not be sent to collections but KRISTINE will work with them internally on any remaining  balance.      Toenail clipping taken from the right great toenail   Return in about 1 year (around 08/14/2025) for TBSE, hx of Dysplastic nevus .  IFay Kirks, CMA, am acting as scribe for Alm Rhyme, MD .   Documentation: I  have reviewed the above documentation for accuracy and completeness, and I agree with the above.  Alm Rhyme, MD

## 2024-08-15 ENCOUNTER — Encounter: Payer: Self-pay | Admitting: Dermatology

## 2024-08-16 ENCOUNTER — Other Ambulatory Visit: Payer: Self-pay | Admitting: Urology

## 2024-08-17 ENCOUNTER — Telehealth: Payer: Self-pay

## 2024-08-17 MED ORDER — TAVABOROLE 5 % EX SOLN: CUTANEOUS | 11 refills | Status: AC

## 2024-08-17 MED ORDER — KETOCONAZOLE 2 % EX CREA: TOPICAL_CREAM | CUTANEOUS | 11 refills | Status: AC

## 2024-08-17 NOTE — Addendum Note (Signed)
 Addended by: WILLMA KNEE A on: 08/17/2024 10:11 AM   Modules accepted: Orders

## 2024-08-17 NOTE — Telephone Encounter (Signed)
 Discussed pathology results with patient and medications sent to pharmacy.

## 2024-08-17 NOTE — Telephone Encounter (Signed)
 Molecular pathogen report scanned into media. Please advise.

## 2024-08-18 LAB — SURGICAL PATHOLOGY

## 2024-08-21 ENCOUNTER — Ambulatory Visit: Payer: Self-pay | Admitting: Dermatology

## 2024-08-22 ENCOUNTER — Encounter: Payer: Self-pay | Admitting: Dermatology

## 2024-08-22 NOTE — Telephone Encounter (Addendum)
 Called and discussed results with patient. He verbalized understanding and denied further questions. Will recheck at next follow up  ----- Message from Roy Austin sent at 08/21/2024  5:22 PM EST ----- FINAL DIAGNOSIS        1. Skin, low back spinal :       DYSPLASTIC COMPOUND NEVUS WITH MODERATE ATYPIA, LIMITED MARGINS FREE        2. Skin, left mid to low back paraspinal :       DYSPLASTIC COMPOUND NEVUS WITH MODERATE ATYPIA, PERIPHERAL AND DEEP MARGINS       INVOLVED    1&2 - Both Moderate Dysplastic Recheck next visit      ----- Message ----- From: Interface, Lab In Three Zero One Sent: 08/18/2024   4:52 PM EST To: Roy JAYSON Rhyme, MD

## 2024-10-10 NOTE — Progress Notes (Signed)
 "    10/11/2024 8:35 PM   Roy Austin Nov 20, 1960 [id number]  Referring provider: No referring provider defined for this encounter.  Urological history: 1. Genital herpes - Valtrex  1000 mg daily  2. BPH with LU TS - PSA (05/2024) 1.0  3. Hypogonadism - testosterone  level pending - hemoglobin/hematocrit pending - Xyosted  100 mg every 7 days   Chief Complaint  Patient presents with   Follow-up   HPI: Roy Austin is a 64 y.o. man with Parkinson's disease who presents today for transferring care of his TRT from his PCP.    Previous records reviewed.  He is suffering from side effects from [facility] with one of his issues being compromised renal function.  He does drink a lot of water to stay hydrated in order to preserve his renal function.   He is currently on Xyosted  100 mg denies new complaints of low libido, erectile dysfunction, fatigue, or mood changes.  No complaints of gynecomastia, visual changes, or thromboembolic symptoms.  Energy level, libido and overall sense of wellbeing being reported as stable/ improved compared to prior visit.    I PSS 18/3  He reports sensation of incomplete bladder emptying, urinary frequency, urinary intermittency, urinary urgency, a weak urinary stream, and nocturia x 3.  He has some leaking before being able to reach the restroom and now has an urinal at his side almost all the time.    Patient denies any modifying or aggravating factors.  Patient denies any recent UTI's, gross hematuria, dysuria or suprapubic/flank pain.  Patient denies any fevers, chills, nausea or vomiting.    He does drink copious amounts of water in order to preserve his renal function.    PVR 158 mL   Serum creatinine (03/2024) 1. 49  SHIM 25  Patient still having spontaneous erections.  He denies any pain or curvature with erections.    Labs are pending.   PMH: Past Medical History:  Diagnosis Date   Dysplastic nevus 07/18/2009   mid back  - moderate, excision 08/20/2009   Dysplastic nevus 12/04/2020   left LQA - moderate   Dysplastic nevus 08/14/2024   low back spinal - moderate - recheck at followup   Dysplastic nevus 08/14/2024   left mid to low back paraspinal - moderate - recheck at followup    Surgical History: No past surgical history on file.  Home Medications:  Allergies as of 10/11/2024       Reactions   Olanzapine Swelling, Anxiety   agitation  agitation  agitation agitation   Citalopram Other (See Comments)        Medication List        Accurate as of October 11, 2024 11:59 PM. If you have any questions, ask your nurse or doctor.          STOP taking these medications    Xyosted  75 MG/0.5ML Soaj Generic drug: Testosterone  Enanthate Replaced by: Xyosted  100 MG/0.5ML Soaj Stopped by: Grabiel Schmutz       TAKE these medications    carbidopa-levodopa 25-100 MG tablet Commonly known as: SINEMET IR Take 1 tablet by mouth 3 (three) times daily. The timing of this medication is very important.   AeroChamber MV inhaler Use as instructed   albuterol  108 (90 Base) MCG/ACT inhaler Commonly known as: VENTOLIN  HFA Inhale 1-2 puffs into the lungs every 4 (four) hours as needed for wheezing or shortness of breath.   amoxicillin -clavulanate 875-125 MG tablet Commonly known as: AUGMENTIN  Take 1 tablet  by mouth every 12 (twelve) hours.   buPROPion 200 MG 12 hr tablet Commonly known as: WELLBUTRIN SR Take 1 tablet by mouth 2 (two) times daily.   clobetasol  cream 0.05 % Commonly known as: TEMOVATE  Apply to affected skin qd-bid prn, Avoid applying to face, groin, and axilla. Use as directed. Long-term use can cause thinning of the skin.   fluticasone  50 MCG/ACT nasal spray Commonly known as: FLONASE  Place 2 sprays into both nostrils daily.   Gemtesa  75 MG Tabs Generic drug: Vibegron  Take 1 tablet (75 mg total) by mouth daily. Started by: Myeasha Ballowe   ketoconazole  2 %  cream Commonly known as: NIZORAL  Apply to the feet QHS.   lisinopril 5 MG tablet Commonly known as: ZESTRIL Take 5 mg by mouth daily. for high blood pressure   lithium carbonate 300 MG ER tablet Commonly known as: LITHOBID TK 3 TS PO QD   mometasone  0.1 % cream Commonly known as: ELOCON  Apply 1 Application topically daily. Apply topically to affected area of rash at penis  daily until clear 3 days weekly on M-W-F   promethazine -dextromethorphan 6.25-15 MG/5ML syrup Commonly known as: PROMETHAZINE -DM Take 5 mLs by mouth 4 (four) times daily as needed for cough.   propranolol 20 MG tablet Commonly known as: INDERAL Take by mouth.   risperiDONE 0.5 MG tablet Commonly known as: RISPERDAL Take 0.5 mg by mouth.   rivastigmine 1.5 MG capsule Commonly known as: EXELON Take 1.5 mg by mouth 2 (two) times daily.   tamsulosin  0.4 MG Caps capsule Commonly known as: FLOMAX  Take 1 capsule (0.4 mg total) by mouth daily. Started by: Adeline Petitfrere   Tavaborole  5 % Soln Apply to toenails once a day   valACYclovir  1000 MG tablet Commonly known as: VALTREX  TAKE 1 TABLET BY MOUTH EVERY DAY   Xyosted  100 MG/0.5ML Soaj Generic drug: Testosterone  Enanthate Inject 0.5 mLs (100 mg total) into the skin every 14 (fourteen) days. Replaces: Xyosted  75 MG/0.5ML Soaj Started by: CLOTILDA CORNWALL        Allergies: Allergies[1]  Family History: No family history on file.  Social History:  reports that he has never smoked. He has quit using smokeless tobacco. No history on file for alcohol use and drug use.  ROS: Pertinent ROS in HPI  Physical Exam: BP (!) 153/95   Pulse 80   Wt 235 lb (106.6 kg)   SpO2 96%   BMI 35.73 kg/m   Constitutional:  Well nourished. Alert and oriented, No acute distress. HEENT: [place] AT, moist mucus membranes.  Trachea midline Cardiovascular: No clubbing, cyanosis, or edema. Respiratory: Normal respiratory effort, no increased work of  breathing. Neurologic: Grossly intact, no focal deficits, moving all 4 extremities. Psychiatric: Normal mood and affect.  Laboratory Data: See Epic and HPI   I have reviewed the labs.   Pertinent Imaging:  10/11/24 16:24  Scan Result   Assessment & Plan:    1. Hypogonadism  -testosterone  levels pending -H & H pending  -continue Xyosted  100 mg every 7 days  2. BPH with LUTS -PSA pending  -PVR < 300 cc -symptoms - urge incontinence and feelings of incomplete bladder emptying  -continue conservative management, avoiding bladder irritants and timed voiding's -Initiate alpha-blocker (tamsulosin  0.4 mg daily)  3. Urge incontinence - will have a trial of Gemtesa  75 mg daily, # 28 samples given  Return in about 1 month (around 11/11/2024) for I PSS, PVR .  These notes generated with voice recognition software. I apologize  for typographical errors.  CLOTILDA HELON RIGGERS  [facility] [address]  [address] [place], [place] [address] [phone]     [1]  Allergies Allergen Reactions   Olanzapine Swelling and Anxiety    agitation  agitation  agitation  agitation   Citalopram Other (See Comments)   "

## 2024-10-11 ENCOUNTER — Telehealth: Payer: Self-pay

## 2024-10-11 ENCOUNTER — Ambulatory Visit: Admitting: Urology

## 2024-10-11 VITALS — BP 153/95 | HR 80 | Wt 235.0 lb

## 2024-10-11 DIAGNOSIS — N3281 Overactive bladder: Secondary | ICD-10-CM

## 2024-10-11 DIAGNOSIS — N529 Male erectile dysfunction, unspecified: Secondary | ICD-10-CM | POA: Diagnosis not present

## 2024-10-11 DIAGNOSIS — N401 Enlarged prostate with lower urinary tract symptoms: Secondary | ICD-10-CM

## 2024-10-11 DIAGNOSIS — E291 Testicular hypofunction: Secondary | ICD-10-CM | POA: Diagnosis not present

## 2024-10-11 LAB — BLADDER SCAN AMB NON-IMAGING

## 2024-10-11 MED ORDER — GEMTESA 75 MG PO TABS: 75.0000 mg | ORAL_TABLET | Freq: Every day | ORAL | Status: AC

## 2024-10-11 MED ORDER — XYOSTED 100 MG/0.5ML ~~LOC~~ SOAJ: 100.0000 mg | SUBCUTANEOUS | 3 refills | Status: AC

## 2024-10-11 MED ORDER — XYOSTED 75 MG/0.5ML ~~LOC~~ SOAJ: 75.0000 mg | SUBCUTANEOUS | 3 refills | Status: DC

## 2024-10-11 MED ORDER — TAMSULOSIN HCL 0.4 MG PO CAPS: 0.4000 mg | ORAL_CAPSULE | Freq: Every day | ORAL | 3 refills | Status: AC

## 2024-10-11 NOTE — Telephone Encounter (Signed)
 Patient's wife called regarding appt 08/2024. Patient had a very unpleasant experience in our office that day with Dr. Hester and MA. Multiple things happened during the office visit that made the patient and spouse uncomfortable. The main complaint is Dr. Hester wanted a nail fungal test done and this was declined twice by the patient but the nail was clipped and sent off anyway's. They have now received an almost $800 bill from [employer]. I have spoke with our rep Tamera and the company and the patient are going to fight the bill with our rep to get to drastically cheaper or completely taken care of. I spoke with patient's spouse for 10 minutes apologizing for their experience and we want to them know that and offering to be seen with another provider. At this time patient is going to keep TBSE in November and will call me directly with any questions or concerns.

## 2024-10-12 ENCOUNTER — Telehealth: Payer: Self-pay | Admitting: *Deleted

## 2024-10-12 ENCOUNTER — Ambulatory Visit: Payer: Self-pay | Admitting: Urology

## 2024-10-12 DIAGNOSIS — E291 Testicular hypofunction: Secondary | ICD-10-CM

## 2024-10-12 LAB — PSA: Prostate Specific Ag, Serum: 1 ng/mL (ref 0.0–4.0)

## 2024-10-12 LAB — TESTOSTERONE: Testosterone: 100 ng/dL — ABNORMAL LOW (ref 264–916)

## 2024-10-12 LAB — HEMOGLOBIN AND HEMATOCRIT, BLOOD
Hematocrit: 55.3 % — ABNORMAL HIGH (ref 37.5–51.0)
Hemoglobin: 19.1 g/dL — ABNORMAL HIGH (ref 13.0–17.7)

## 2024-10-12 NOTE — Telephone Encounter (Signed)
 Patient wife called in today and states they are going to the [place] tomorrow and they are going to handle his xyosted  there. Per patient wife.

## 2024-10-13 ENCOUNTER — Telehealth: Payer: Self-pay

## 2024-10-13 NOTE — Telephone Encounter (Signed)
 Called patient in regards to Gemtesa  samples. Advised patient of office hours to pick up samples. Patient verbalized understanding.   Andrea Kirks LPN

## 2024-10-15 ENCOUNTER — Encounter: Payer: Self-pay | Admitting: Urology

## 2024-10-16 NOTE — Telephone Encounter (Signed)
 Called pt to schedule lab visit to repeat hemaglobin and hematocrit one week after he donates blood. Pt states he was going to do it this week but he is out of town this week and will do it at the end of January. Lab appt scheduled, pt aware.

## 2024-10-24 ENCOUNTER — Telehealth: Payer: Self-pay | Admitting: Urology

## 2024-10-24 NOTE — Telephone Encounter (Signed)
 Patient's wife called wanting to know if patient will be able to get testosterone  based off of latest lab results from [place] that were dropped off on 10/23/2024. Results were dropped in chart. Please advise.

## 2024-10-31 ENCOUNTER — Other Ambulatory Visit: Payer: Self-pay

## 2024-10-31 DIAGNOSIS — E291 Testicular hypofunction: Secondary | ICD-10-CM

## 2024-10-31 NOTE — Telephone Encounter (Signed)
 Left vm message advising to call office in regards to lab appointment and medication( Testosterone ) management. Called to make sure message from provider was reviewed with patient. It does show that lab appointment was made. Just wanted to confirm that patient was aware of the full message.  Andrea Kirks LPN

## 2024-11-10 ENCOUNTER — Other Ambulatory Visit

## 2024-11-10 DIAGNOSIS — E291 Testicular hypofunction: Secondary | ICD-10-CM

## 2024-11-13 ENCOUNTER — Ambulatory Visit: Admitting: Urology

## 2024-11-13 DIAGNOSIS — N401 Enlarged prostate with lower urinary tract symptoms: Secondary | ICD-10-CM

## 2024-11-20 ENCOUNTER — Ambulatory Visit

## 2024-11-23 ENCOUNTER — Ambulatory Visit

## 2024-11-29 ENCOUNTER — Ambulatory Visit

## 2024-12-01 ENCOUNTER — Ambulatory Visit

## 2025-04-17 ENCOUNTER — Ambulatory Visit: Admitting: Urology

## 2025-04-17 ENCOUNTER — Other Ambulatory Visit

## 2025-04-20 ENCOUNTER — Ambulatory Visit: Admitting: Urology

## 2025-08-14 ENCOUNTER — Ambulatory Visit: Admitting: Dermatology
